# Patient Record
Sex: Male | Born: 1985
Health system: Southern US, Community
[De-identification: ages and names within clinical notes are randomized; demographics above are authoritative.]

---

## 2010-03-14 ENCOUNTER — Encounter: Payer: Self-pay | Admitting: Cardiology

## 2010-03-31 DIAGNOSIS — R0789 Other chest pain: Secondary | ICD-10-CM | POA: Insufficient documentation

## 2010-04-02 ENCOUNTER — Ambulatory Visit: Payer: Self-pay | Admitting: Cardiology

## 2010-04-02 ENCOUNTER — Encounter: Payer: Self-pay | Admitting: Cardiology

## 2010-04-02 DIAGNOSIS — R002 Palpitations: Secondary | ICD-10-CM | POA: Insufficient documentation

## 2010-09-24 ENCOUNTER — Emergency Department (HOSPITAL_COMMUNITY)
Admission: EM | Admit: 2010-09-24 | Discharge: 2010-09-24 | Payer: Self-pay | Source: Home / Self Care | Admitting: Family Medicine

## 2010-10-07 NOTE — Assessment & Plan Note (Signed)
Summary: [facility]   Visit Type:  Initial Consult  CC:  Chest wall pain.  History of Present Illness: 25 year old male for evaluation of chest pain palpitations. No prior cardiac history. TSH 03/16/10 normal at 1.660. Pt states that in May he was having intermittent chest pain. It was in various locations on his chest. It was described as a tightness. It would last seconds and resolve spontaneously. There was no associated nausea, shortness of breath or diaphoresis. It would also occasionally be in his back. It was not pleuritic, positional, related to food or exertional. He was also having occasional palpitations. He was "more aware" of his heart beat. His heart did not race and there was no presyncope or syncope. He otherwise denies any dyspnea on exertion, orthopnea, PND or pedal edema. He does not have exertional chest pain. Because of the above we are asked to further evaluate. Note all of his symptoms have improved since May.  Preventive Screening-Counseling & Management  Alcohol-Tobacco     Smoking Status: never  Current Medications (verified): 1)  Claritin 10 Mg Tabs (Loratadine) .... As Needed  Allergies (verified): No Known Drug Allergies  Past History:  Past Medical History: None  Past Surgical History: None  Family History: Reviewed history and no changes required. None premature CAD or sudden death  Social History: Reviewed history from 03/31/2010 and no changes required. Part Time  Single  Tobacco Use - No.  Alcohol Use - no Smoking Status:  never  Review of Systems       no fevers or chills, productive cough, hemoptysis, dysphasia, odynophagia, melena, hematochezia, dysuria, hematuria, rash, seizure activity, orthopnea, PND, pedal edema, claudication. Remaining systems are negative.   Vital Signs:  Patient profile:   25 year old male Height:      74 inches Weight:      170.50 pounds BMI:     21.97 Pulse rate:   71 / minute Pulse rhythm:    regular Resp:     18 per minute BP sitting:   104 / 70  (left arm) Cuff size:   large  Vitals Entered By: Vikki Ports (April 02, 2010 9:44 AM)  Physical Exam  General:  Well developed/well nourished in NAD Skin warm/dry Patient not depressed but mildly anxious appearing No peripheral clubbing Back-normal HEENT-normal/normal eyelids Neck supple/normal carotid upstroke bilaterally; no bruits; no JVD; no thyromegaly chest - CTA/ normal expansion CV - RRR/normal S1 and S2; no murmurs, rubs or gallops;  PMI nondisplaced Abdomen -NT/ND, no HSM, no mass, + bowel sounds, no bruit 2+ femoral pulses, no bruits Ext-no edema, chords, 2+ DP Neuro-grossly nonfocal     EKG  Procedure date:  03/14/2010  Findings:      Normal sinus rhythm, incomplete right bundle branch block.  EKG  Procedure date:  04/02/2010  Findings:      Normal sinus rhythm at a rate of 72. Axis normal. Incomplete right bundle branch block.  Impression & Recommendations:  Problem # 1:  CHEST DISCOMFORT (ICD-786.59) Symptoms extremely atypical and I do not feel are cardiac. They are also improving. I will not pursue further cardiac evaluation at this time unless his symptoms return. Note electrocardiogram shows no ST changes. Orders: EKG w/ Interpretation (93000)  Problem # 2:  PALPITATIONS (ICD-785.1) These are also improving. He describes a sensation of being aware of his heartbeat. Given that they are improving I will not pursue further evaluation. If they worsen in the future we will consider a CardioNet.  Patient  Instructions: 1)  Your physician recommends that you schedule a follow-up appointment in: AS NEEDED 2)  Your physician recommends that you continue on your current medications as directed. Please refer to the Current Medication list given to you today.

## 2010-10-07 NOTE — Letter (Signed)
Summary: [facility] [place]  [facility] [place]   Imported By: Cala Bradford Mesiemore 04/17/2010 13:28:02  _____________________________________________________________________  External Attachment:    Type:   Image     Comment:   External Document

## 2011-04-16 ENCOUNTER — Encounter: Payer: Self-pay | Admitting: Cardiology

## 2015-11-21 DIAGNOSIS — M62838 Other muscle spasm: Secondary | ICD-10-CM | POA: Diagnosis not present

## 2015-11-21 DIAGNOSIS — M9901 Segmental and somatic dysfunction of cervical region: Secondary | ICD-10-CM | POA: Diagnosis not present

## 2015-11-25 DIAGNOSIS — M62838 Other muscle spasm: Secondary | ICD-10-CM | POA: Diagnosis not present

## 2015-11-25 DIAGNOSIS — M9901 Segmental and somatic dysfunction of cervical region: Secondary | ICD-10-CM | POA: Diagnosis not present

## 2015-11-28 DIAGNOSIS — M9901 Segmental and somatic dysfunction of cervical region: Secondary | ICD-10-CM | POA: Diagnosis not present

## 2015-11-28 DIAGNOSIS — M62838 Other muscle spasm: Secondary | ICD-10-CM | POA: Diagnosis not present

## 2015-12-05 DIAGNOSIS — M62838 Other muscle spasm: Secondary | ICD-10-CM | POA: Diagnosis not present

## 2015-12-05 DIAGNOSIS — M9901 Segmental and somatic dysfunction of cervical region: Secondary | ICD-10-CM | POA: Diagnosis not present

## 2015-12-11 DIAGNOSIS — M62838 Other muscle spasm: Secondary | ICD-10-CM | POA: Diagnosis not present

## 2015-12-11 DIAGNOSIS — M9901 Segmental and somatic dysfunction of cervical region: Secondary | ICD-10-CM | POA: Diagnosis not present

## 2015-12-26 DIAGNOSIS — M62838 Other muscle spasm: Secondary | ICD-10-CM | POA: Diagnosis not present

## 2015-12-26 DIAGNOSIS — M9901 Segmental and somatic dysfunction of cervical region: Secondary | ICD-10-CM | POA: Diagnosis not present

## 2016-01-23 DIAGNOSIS — M9901 Segmental and somatic dysfunction of cervical region: Secondary | ICD-10-CM | POA: Diagnosis not present

## 2016-01-23 DIAGNOSIS — M62838 Other muscle spasm: Secondary | ICD-10-CM | POA: Diagnosis not present

## 2016-02-20 DIAGNOSIS — M9901 Segmental and somatic dysfunction of cervical region: Secondary | ICD-10-CM | POA: Diagnosis not present

## 2016-02-20 DIAGNOSIS — M62838 Other muscle spasm: Secondary | ICD-10-CM | POA: Diagnosis not present

## 2016-03-19 DIAGNOSIS — M9901 Segmental and somatic dysfunction of cervical region: Secondary | ICD-10-CM | POA: Diagnosis not present

## 2016-03-19 DIAGNOSIS — M62838 Other muscle spasm: Secondary | ICD-10-CM | POA: Diagnosis not present

## 2016-04-16 DIAGNOSIS — H5213 Myopia, bilateral: Secondary | ICD-10-CM | POA: Diagnosis not present

## 2016-04-23 DIAGNOSIS — M62838 Other muscle spasm: Secondary | ICD-10-CM | POA: Diagnosis not present

## 2016-04-23 DIAGNOSIS — M9901 Segmental and somatic dysfunction of cervical region: Secondary | ICD-10-CM | POA: Diagnosis not present

## 2016-05-26 DIAGNOSIS — M9901 Segmental and somatic dysfunction of cervical region: Secondary | ICD-10-CM | POA: Diagnosis not present

## 2016-05-26 DIAGNOSIS — M62838 Other muscle spasm: Secondary | ICD-10-CM | POA: Diagnosis not present

## 2016-07-02 DIAGNOSIS — M9901 Segmental and somatic dysfunction of cervical region: Secondary | ICD-10-CM | POA: Diagnosis not present

## 2016-07-02 DIAGNOSIS — M62838 Other muscle spasm: Secondary | ICD-10-CM | POA: Diagnosis not present

## 2016-08-06 DIAGNOSIS — M62838 Other muscle spasm: Secondary | ICD-10-CM | POA: Diagnosis not present

## 2016-08-06 DIAGNOSIS — M9901 Segmental and somatic dysfunction of cervical region: Secondary | ICD-10-CM | POA: Diagnosis not present

## 2016-09-10 DIAGNOSIS — M9901 Segmental and somatic dysfunction of cervical region: Secondary | ICD-10-CM | POA: Diagnosis not present

## 2016-09-10 DIAGNOSIS — M62838 Other muscle spasm: Secondary | ICD-10-CM | POA: Diagnosis not present

## 2016-10-01 DIAGNOSIS — M9901 Segmental and somatic dysfunction of cervical region: Secondary | ICD-10-CM | POA: Diagnosis not present

## 2016-10-01 DIAGNOSIS — M62838 Other muscle spasm: Secondary | ICD-10-CM | POA: Diagnosis not present

## 2016-10-29 DIAGNOSIS — M62838 Other muscle spasm: Secondary | ICD-10-CM | POA: Diagnosis not present

## 2016-10-29 DIAGNOSIS — M9901 Segmental and somatic dysfunction of cervical region: Secondary | ICD-10-CM | POA: Diagnosis not present

## 2016-11-18 DIAGNOSIS — M62838 Other muscle spasm: Secondary | ICD-10-CM | POA: Diagnosis not present

## 2016-11-18 DIAGNOSIS — M9901 Segmental and somatic dysfunction of cervical region: Secondary | ICD-10-CM | POA: Diagnosis not present

## 2016-11-26 DIAGNOSIS — M9901 Segmental and somatic dysfunction of cervical region: Secondary | ICD-10-CM | POA: Diagnosis not present

## 2016-11-26 DIAGNOSIS — M62838 Other muscle spasm: Secondary | ICD-10-CM | POA: Diagnosis not present

## 2016-12-24 DIAGNOSIS — M62838 Other muscle spasm: Secondary | ICD-10-CM | POA: Diagnosis not present

## 2016-12-24 DIAGNOSIS — M9901 Segmental and somatic dysfunction of cervical region: Secondary | ICD-10-CM | POA: Diagnosis not present

## 2017-01-21 DIAGNOSIS — M9901 Segmental and somatic dysfunction of cervical region: Secondary | ICD-10-CM | POA: Diagnosis not present

## 2017-01-21 DIAGNOSIS — M62838 Other muscle spasm: Secondary | ICD-10-CM | POA: Diagnosis not present

## 2017-02-18 DIAGNOSIS — M62838 Other muscle spasm: Secondary | ICD-10-CM | POA: Diagnosis not present

## 2017-02-18 DIAGNOSIS — M9901 Segmental and somatic dysfunction of cervical region: Secondary | ICD-10-CM | POA: Diagnosis not present

## 2017-02-25 DIAGNOSIS — M9901 Segmental and somatic dysfunction of cervical region: Secondary | ICD-10-CM | POA: Diagnosis not present

## 2017-02-25 DIAGNOSIS — M62838 Other muscle spasm: Secondary | ICD-10-CM | POA: Diagnosis not present

## 2017-03-18 DIAGNOSIS — M9901 Segmental and somatic dysfunction of cervical region: Secondary | ICD-10-CM | POA: Diagnosis not present

## 2017-03-18 DIAGNOSIS — M62838 Other muscle spasm: Secondary | ICD-10-CM | POA: Diagnosis not present

## 2017-04-15 DIAGNOSIS — M62838 Other muscle spasm: Secondary | ICD-10-CM | POA: Diagnosis not present

## 2017-04-15 DIAGNOSIS — M9901 Segmental and somatic dysfunction of cervical region: Secondary | ICD-10-CM | POA: Diagnosis not present

## 2017-05-20 DIAGNOSIS — M62838 Other muscle spasm: Secondary | ICD-10-CM | POA: Diagnosis not present

## 2017-05-20 DIAGNOSIS — M9901 Segmental and somatic dysfunction of cervical region: Secondary | ICD-10-CM | POA: Diagnosis not present

## 2017-06-24 DIAGNOSIS — M62838 Other muscle spasm: Secondary | ICD-10-CM | POA: Diagnosis not present

## 2017-06-24 DIAGNOSIS — M9901 Segmental and somatic dysfunction of cervical region: Secondary | ICD-10-CM | POA: Diagnosis not present

## 2017-07-22 DIAGNOSIS — M62838 Other muscle spasm: Secondary | ICD-10-CM | POA: Diagnosis not present

## 2017-07-22 DIAGNOSIS — M9901 Segmental and somatic dysfunction of cervical region: Secondary | ICD-10-CM | POA: Diagnosis not present

## 2017-08-19 DIAGNOSIS — M62838 Other muscle spasm: Secondary | ICD-10-CM | POA: Diagnosis not present

## 2017-08-19 DIAGNOSIS — M9901 Segmental and somatic dysfunction of cervical region: Secondary | ICD-10-CM | POA: Diagnosis not present

## 2017-09-16 DIAGNOSIS — M9901 Segmental and somatic dysfunction of cervical region: Secondary | ICD-10-CM | POA: Diagnosis not present

## 2017-09-16 DIAGNOSIS — M62838 Other muscle spasm: Secondary | ICD-10-CM | POA: Diagnosis not present

## 2017-10-14 DIAGNOSIS — M9901 Segmental and somatic dysfunction of cervical region: Secondary | ICD-10-CM | POA: Diagnosis not present

## 2017-10-14 DIAGNOSIS — M62838 Other muscle spasm: Secondary | ICD-10-CM | POA: Diagnosis not present

## 2017-11-11 DIAGNOSIS — M9901 Segmental and somatic dysfunction of cervical region: Secondary | ICD-10-CM | POA: Diagnosis not present

## 2017-11-11 DIAGNOSIS — M62838 Other muscle spasm: Secondary | ICD-10-CM | POA: Diagnosis not present

## 2017-11-24 DIAGNOSIS — H5213 Myopia, bilateral: Secondary | ICD-10-CM | POA: Diagnosis not present

## 2017-12-09 DIAGNOSIS — M9901 Segmental and somatic dysfunction of cervical region: Secondary | ICD-10-CM | POA: Diagnosis not present

## 2017-12-09 DIAGNOSIS — M62838 Other muscle spasm: Secondary | ICD-10-CM | POA: Diagnosis not present

## 2018-01-10 DIAGNOSIS — M62838 Other muscle spasm: Secondary | ICD-10-CM | POA: Diagnosis not present

## 2018-01-10 DIAGNOSIS — M9901 Segmental and somatic dysfunction of cervical region: Secondary | ICD-10-CM | POA: Diagnosis not present

## 2018-02-09 DIAGNOSIS — M9901 Segmental and somatic dysfunction of cervical region: Secondary | ICD-10-CM | POA: Diagnosis not present

## 2018-02-09 DIAGNOSIS — M62838 Other muscle spasm: Secondary | ICD-10-CM | POA: Diagnosis not present

## 2018-02-14 DIAGNOSIS — M9901 Segmental and somatic dysfunction of cervical region: Secondary | ICD-10-CM | POA: Diagnosis not present

## 2018-02-14 DIAGNOSIS — M62838 Other muscle spasm: Secondary | ICD-10-CM | POA: Diagnosis not present

## 2018-02-23 ENCOUNTER — Ambulatory Visit (INDEPENDENT_AMBULATORY_CARE_PROVIDER_SITE_OTHER): Payer: Self-pay | Admitting: Nurse Practitioner

## 2018-02-23 VITALS — BP 105/70 | HR 70 | Temp 97.8°F | Resp 16 | Wt 194.0 lb

## 2018-02-23 DIAGNOSIS — Z0000 Encounter for general adult medical examination without abnormal findings: Secondary | ICD-10-CM

## 2018-02-23 NOTE — Progress Notes (Signed)
Subjective:  Dennis Grant is a 32 y.o. male who presents for basic physical exam.  Patient presents today for his basic health assessment as a requirement for [employer] to keep his health premiums low.  The patient denies any past meds medical history of heart disease, lung disease, kidney disease, diabetes, asthma, or seizures.  The patient does have a history of seasonal allergies for which she takes Zyrtec daily.  Patient denies any current health related concerns today.  Reviewed the patient's current medications, and allergies.  The patient denies any past surgical history other than wisdom tooth extraction.   Patient's family medical history includes hypertension on his mother's side, and the patient states his father does not have any health problems at this time.  The patient's immunizations are up-to-date.  The patient denies any history of recreational drug use, smoking, or alcohol use.  The patient does not have any kids, and does not pick married, the patient lives at home with a roommate.   Social History   Tobacco Use  . Smoking status: Never Smoker  Substance Use Topics  . Alcohol use: No  . Drug use: Not on file     Current Outpatient Medications  Medication Sig Dispense Refill  . cetirizine (ZYRTEC) 10 MG tablet Take 10 mg by mouth daily.    Marland Kitchen loratadine (CLARITIN) 10 MG tablet Take 10 mg by mouth as needed.       No current facility-administered medications for this visit.     Review of Systems  Constitutional: Negative.   HENT: Negative.   Eyes: Negative.   Respiratory: Negative.   Cardiovascular: Positive for palpitations.       Has a history of heart palpitations in 2012.  Patient states this issue is now resolved.  Gastrointestinal: Negative.   Genitourinary: Negative.   Musculoskeletal: Negative.   Skin: Negative.   Endo/Heme/Allergies: Positive for environmental allergies.     Objective:  BP 105/70 (BP Location: Right Arm, Patient Position:  Sitting, Cuff Size: Normal)   Pulse 70   Temp 97.8 F (36.6 C) (Oral)   Resp 16   Wt 194 lb (88 kg)   SpO2 99%   BMI 24.91 kg/m   General Appearance:  Alert, cooperative, no distress, appears stated age  Head:  Normocephalic, without obvious abnormality, atraumatic  Eyes:  PERRL, conjunctiva/corneas clear, EOM's intact, fundi benign, both eyes  Ears:  Normal TM's and external ear canals, both ears  Nose: Nares normal, septum midline, mucosa normal, no drainage or sinus tenderness  Throat: Lips, mucosa, and tongue normal; teeth and gums normal  Neck: Supple, symmetrical, trachea midline, no adenopathy, thyroid: not enlarged, symmetric, no tenderness/mass/nodules, no carotid bruit or JVD  Back:   Symmetric, no curvature, ROM normal, no CVA tenderness  Lungs:   Clear to auscultation bilaterally, respirations unlabored  Chest Wall:  No tenderness or deformity  Heart:  Regular rate and rhythm, S1, S2 normal, no murmur, rub or gallop  Abdomen:   Soft, non-tender, bowel sounds active all four quadrants,  no masses, no organomegaly  Genitalia:  Deferred  Rectal:  Deferred  Extremities: Extremities normal, atraumatic, no cyanosis or edema  Pulses: 2+ and symmetric  Skin: Skin color, texture, turgor normal, no rashes or lesions  Lymph nodes: Cervical, supraclavicular, nodes normal  Neurologic: Normal        Assessment:  basic physical exam    Plan:  Patient education provided.  Directed patient to follow-up with PCP to have an EKG  since it has been quite some time.  This will help establish a baseline for the patient in case he develops any other heart symptoms.  Patient education was provided on health maintenance and health prevention for his age group. The patient  will also follow with his PCP for lab work, or any further diagnostic testing.  The patient verbalizes understanding and has no questions at time of discharge. Patient will follow up with PCP.

## 2018-02-23 NOTE — Patient Instructions (Signed)
Health Maintenance, Male A healthy lifestyle and preventive care is important for your health and wellness. Ask your health care provider about what schedule of regular examinations is right for you. What should I know about weight and diet? Eat a Healthy Diet  Eat plenty of vegetables, fruits, whole grains, low-fat dairy products, and lean protein.  Do not eat a lot of foods high in solid fats, added sugars, or salt.  Maintain a Healthy Weight Regular exercise can help you achieve or maintain a healthy weight. You should:  Do at least 150 minutes of exercise each week. The exercise should increase your heart rate and make you sweat (moderate-intensity exercise).  Do strength-training exercises at least twice a week.  Watch Your Levels of Cholesterol and Blood Lipids  Have your blood tested for lipids and cholesterol every 5 years starting at 34 years of age. If you are at high risk for heart disease, you should start having your blood tested when you are 34 years old. You may need to have your cholesterol levels checked more often if: ? Your lipid or cholesterol levels are high. ? You are older than 34 years of age. ? You are at high risk for heart disease.  What should I know about cancer screening? Many types of cancers can be detected early and may often be prevented. Lung Cancer  You should be screened every year for lung cancer if: ? You are a current smoker who has smoked for at least 30 years. ? You are a former smoker who has quit within the past 15 years.  Talk to your health care provider about your screening options, when you should start screening, and how often you should be screened.  Colorectal Cancer  Routine colorectal cancer screening usually begins at 34 years of age and should be repeated every 5-10 years until you are 34 years old. You may need to be screened more often if early forms of precancerous polyps or small growths are found. Your health care provider  may recommend screening at an earlier age if you have risk factors for colon cancer.  Your health care provider may recommend using home test kits to check for hidden blood in the stool.  A small camera at the end of a tube can be used to examine your colon (sigmoidoscopy or colonoscopy). This checks for the earliest forms of colorectal cancer.  Prostate and Testicular Cancer  Depending on your age and overall health, your health care provider may do certain tests to screen for prostate and testicular cancer.  Talk to your health care provider about any symptoms or concerns you have about testicular or prostate cancer.  Skin Cancer  Check your skin from head to toe regularly.  Tell your health care provider about any new moles or changes in moles, especially if: ? There is a change in a mole's size, shape, or color. ? You have a mole that is larger than a pencil eraser.  Always use sunscreen. Apply sunscreen liberally and repeat throughout the day.  Protect yourself by wearing long sleeves, pants, a wide-brimmed hat, and sunglasses when outside.  What should I know about heart disease, diabetes, and high blood pressure?  If you are 89-13 years of age, have your blood pressure checked every 3-5 years. If you are 70 years of age or older, have your blood pressure checked every year. You should have your blood pressure measured twice-once when you are at a hospital or clinic, and once when  you are not at a hospital or clinic. Record the average of the two measurements. To check your blood pressure when you are not at a hospital or clinic, you can use: ? An automated blood pressure machine at a pharmacy. ? A home blood pressure monitor.  Talk to your health care provider about your target blood pressure.  If you are between 35-28 years old, ask your health care provider if you should take aspirin to prevent heart disease.  Have regular diabetes screenings by checking your fasting blood  sugar level. ? If you are at a normal weight and have a low risk for diabetes, have this test once every three years after the age of 64. ? If you are overweight and have a high risk for diabetes, consider being tested at a younger age or more often.  A one-time screening for abdominal aortic aneurysm (AAA) by ultrasound is recommended for men aged 71-75 years who are current or former smokers. What should I know about preventing infection? Hepatitis B If you have a higher risk for hepatitis B, you should be screened for this virus. Talk with your health care provider to find out if you are at risk for hepatitis B infection. Hepatitis C Blood testing is recommended for:  Everyone born from [birth year] through 1967.  Anyone with known risk factors for hepatitis C.  Sexually Transmitted Diseases (STDs)  You should be screened each year for STDs including gonorrhea and chlamydia if: ? You are sexually active and are younger than 34 years of age. ? You are older than 34 years of age and your health care provider tells you that you are at risk for this type of infection. ? Your sexual activity has changed since you were last screened and you are at an increased risk for chlamydia or gonorrhea. Ask your health care provider if you are at risk.  Talk with your health care provider about whether you are at high risk of being infected with HIV. Your health care provider may recommend a prescription medicine to help prevent HIV infection.  What else can I do?  Schedule regular health, dental, and eye exams.  Stay current with your vaccines (immunizations).  Do not use any tobacco products, such as cigarettes, chewing tobacco, and e-cigarettes. If you need help quitting, ask your health care provider.  Limit alcohol intake to no more than 2 drinks per day. One drink equals 12 ounces of beer, 5 ounces of wine, or 1 ounces of hard liquor.  Do not use street drugs.  Do not share needles.  Ask your  health care provider for help if you need support or information about quitting drugs.  Tell your health care provider if you often feel depressed.  Tell your health care provider if you have ever been abused or do not feel safe at home. This information is not intended to replace advice given to you by your health care provider. Make sure you discuss any questions you have with your health care provider. Document Released: 02/20/2008 Document Revised: 04/22/2016 Document Reviewed: 05/28/2015 Elsevier Interactive Patient Education  2018 [place] 18-39 Years, Male Preventive care refers to lifestyle choices and visits with your health care provider that can promote health and wellness. What does preventive care include?  A yearly physical exam. This is also called an annual well check.  Dental exams once or twice a year.  Routine eye exams. Ask your health care provider how often you should have  your eyes checked.  Personal lifestyle choices, including: ? Daily care of your teeth and gums. ? Regular physical activity. ? Eating a healthy diet. ? Avoiding tobacco and drug use. ? Limiting alcohol use. ? Practicing safe sex. What happens during an annual well check? The services and screenings done by your health care provider during your annual well check will depend on your age, overall health, lifestyle risk factors, and family history of disease. Counseling Your health care provider may ask you questions about your:  Alcohol use.  Tobacco use.  Drug use.  Emotional well-being.  Home and relationship well-being.  Sexual activity.  Eating habits.  Work and work environment.  Screening You may have the following tests or measurements:  Height, weight, and BMI.  Blood pressure.  Lipid and cholesterol levels. These may be checked every 5 years starting at age 20.  Diabetes screening. This is done by checking your blood sugar (glucose) after you  have not eaten for a while (fasting).  Skin check.  Hepatitis C blood test.  Hepatitis B blood test.  Sexually transmitted disease (STD) testing.  Discuss your test results, treatment options, and if necessary, the need for more tests with your health care provider. Vaccines Your health care provider may recommend certain vaccines, such as:  Influenza vaccine. This is recommended every year.  Tetanus, diphtheria, and acellular pertussis (Tdap, Td) vaccine. You may need a Td booster every 10 years.  Varicella vaccine. You may need this if you have not been vaccinated.  HPV vaccine. If you are 26 or younger, you may need three doses over 6 months.  Measles, mumps, and rubella (MMR) vaccine. You may need at least one dose of MMR.You may also need a second dose.  Pneumococcal 13-valent conjugate (PCV13) vaccine. You may need this if you have certain conditions and have not been vaccinated.  Pneumococcal polysaccharide (PPSV23) vaccine. You may need one or two doses if you smoke cigarettes or if you have certain conditions.  Meningococcal vaccine. One dose is recommended if you are age 19-21 years and a first-year college student living in a residence hall, or if you have one of several medical conditions. You may also need additional booster doses.  Hepatitis A vaccine. You may need this if you have certain conditions or if you travel or work in places where you may be exposed to hepatitis A.  Hepatitis B vaccine. You may need this if you have certain conditions or if you travel or work in places where you may be exposed to hepatitis B.  Haemophilus influenzae type b (Hib) vaccine. You may need this if you have certain risk factors.  Talk to your health care provider about which screenings and vaccines you need and how often you need them. This information is not intended to replace advice given to you by your health care provider. Make sure you discuss any questions you have with  your health care provider. Document Released: 10/20/2001 Document Revised: 05/13/2016 Document Reviewed: 06/25/2015 Elsevier Interactive Patient Education  2018 [employer] Inc[employer]  

## 2018-03-09 DIAGNOSIS — M9901 Segmental and somatic dysfunction of cervical region: Secondary | ICD-10-CM | POA: Diagnosis not present

## 2018-03-09 DIAGNOSIS — M62838 Other muscle spasm: Secondary | ICD-10-CM | POA: Diagnosis not present

## 2018-04-14 DIAGNOSIS — M62838 Other muscle spasm: Secondary | ICD-10-CM | POA: Diagnosis not present

## 2018-04-14 DIAGNOSIS — M9901 Segmental and somatic dysfunction of cervical region: Secondary | ICD-10-CM | POA: Diagnosis not present

## 2018-05-03 ENCOUNTER — Ambulatory Visit: Payer: Self-pay

## 2018-05-03 ENCOUNTER — Ambulatory Visit: Payer: 59 | Admitting: Sports Medicine

## 2018-05-03 ENCOUNTER — Ambulatory Visit (INDEPENDENT_AMBULATORY_CARE_PROVIDER_SITE_OTHER): Payer: 59

## 2018-05-03 ENCOUNTER — Encounter: Payer: Self-pay | Admitting: Sports Medicine

## 2018-05-03 VITALS — BP 110/78 | HR 74 | Ht 74.0 in | Wt 191.4 lb

## 2018-05-03 DIAGNOSIS — M25561 Pain in right knee: Secondary | ICD-10-CM

## 2018-05-03 DIAGNOSIS — M222X1 Patellofemoral disorders, right knee: Secondary | ICD-10-CM | POA: Diagnosis not present

## 2018-05-03 NOTE — Progress Notes (Signed)
PROCEDURE NOTE: THERAPEUTIC EXERCISES (97110) 15 minutes spent for Therapeutic exercises as below and as referenced in the AVS.  This included exercises focusing on stretching, strengthening, with significant focus on eccentric aspects.   Proper technique shown and discussed handout in great detail with ATC.  All questions were discussed and answered.   Long term goals include an improvement in range of motion, strength, endurance as well as avoiding reinjury. Frequency of visits is one time as determined during today's  office visit. Frequency of exercises to be performed is as per handout.  EXERCISES REVIEWED:  Hip ABduction strengthening with focus on Glute Medius Recruitment  VMO Strengthening  With short arc quad

## 2018-05-03 NOTE — Progress Notes (Signed)
Dennis Grant. [facility] [phone]  Dennis Grant - 32 y.o. male MRN [id number]  Date of birth: May 16, 1986  Visit Date: 05/03/2018  PCP: No primary care provider on file.   Referred by: No ref. provider found  Scribe(s) for today's visit: Dennis Grant, CMA  SUBJECTIVE:  Dennis Grant is here for Initial Assessment (R knee pain)   His R knee pain symptoms INITIALLY: Began about 2 months ago. Sx started when riding up right stationary bike. He has not gone for a ride in over a month and sx are persisting. He denies past injury to the R knee.  Described as mild stiffness, non-radiating. Worsened with bending, sitting with knee bent.  Improved with walking, standing. Additional associated symptoms include: He does note popping in the R knee. He did notice mild swelling around the knee when sx were at worst. He reports occasional weakness in the R knee. When sx were at worst he did have trouble with weight bearing. Pain is located distal to the patella.     At this time symptoms are improving compared to onset, over the past 3-4 weeks, not currently riding bike.  He has been taking Motrin/Tylenol with some relief.   No recent XR of R knee.    REVIEW OF SYSTEMS: Reports night time disturbances. Denies fevers, chills, or night sweats. Denies unexplained weight loss. Denies personal history of cancer. Denies changes in bowel or bladder habits. Denies recent unreported falls. Denies new or worsening dyspnea or wheezing. Denies headaches or dizziness.  Reports weakness in R leg/knee.  Denies dizziness or presyncopal episodes Denies lower extremity edema     HISTORY & PERTINENT PRIOR DATA:  Significant/pertinent history, findings, studies include:  reports that he has never smoked. He has never used smokeless tobacco. No results for input(s): HGBA1C, LABURIC, CREATINE in the last 8760 hours. No specialty comments  available. Problem  Patellofemoral Pain Syndrome of Right Knee    Otherwise prior history reviewed and updated per electronic medical record.   OBJECTIVE:  VS:  HT:6\' 2"  (188 cm)   WT:191 lb 6.4 oz (86.8 kg)  BMI:24.56    BP:110/78  HR:74bpm  TEMP: ( )  RESP:98 %   PHYSICAL EXAM: CONSTITUTIONAL: Well-developed, Well-nourished and In no acute distress Alert & appropriately interactive. and Not depressed or anxious appearing. RESPIRATORY: No increased work of breathing and Trachea Midline EYES: Pupils are equal., EOM intact without nystagmus. and No scleral icterus.  Lower extremities: Warm and well perfused NEURO: unremarkable  MSK Exam:  Right Knee  Alignment & Contours: normal Skin: No overlying erythema/ecchymosis Effusion: none   Generalized Synovitis: none Knee Tenderness: Patellar tendon and No focal bony TTP Gait: normal Patellar grind produces: Mild pain and No crepitation   RANGE OF MOTION & STRENGTH  EXTENSION: Normal  with no pain.   Strength: Normal FLEXION: Normal with no pain.   Strength: Normal HIP ABDUCTION: Strength: 4/5 Recruitment pattern: TFL predominant   LIGAMENTOUS TESTING  Varus & Valgus Strain: stable to testing Anterior & Posterior Drawer: stable to testing Lachman's: stable to testing   SPECIALITY TESTING:  Patellar Apprehension: positive, mild pain J Sign: positive, mild pain Mcmurray's: normal, no pain Thessaly: normal, no pain     PROCEDURES & DATA REVIEWED:  . Discussed the foundation of treatment for this condition is physical therapy and/or daily (5-6 days/week) therapeutic exercises, focusing on core strengthening, coordination, neuromuscular control/reeducation.  Therapeutic exercises prescribed  per procedure note. . X-Rays obtained today and reviewed with the patient that showed: Early mild patellofemoral wear but this is minimal.  Otherwise normal x-rays  ASSESSMENT   1. Right knee pain, unspecified chronicity   2.  Patellofemoral pain syndrome of right knee     PLAN:   Apply ice packs    . Please see procedure section and notes. . Symptoms are consistent with patellofemoral pain syndrome . Therapeutic exercises and topical Pennsaid prescription and sample provided. No problem-specific Assessment & Plan notes found for this encounter.   Follow-up: Return if symptoms worsen or fail to improve, for as needed for ongoing issues.      Please see additional documentation for Objective, Assessment and Plan sections. Pertinent additional documentation may be included in corresponding procedure notes, imaging studies, problem based documentation and patient instructions. Please see these sections of the encounter for additional information regarding this visit.  CMA/ATC served as Neurosurgeonscribe during this visit. History, Physical, and Plan performed by medical provider. Documentation and orders reviewed and attested to.      Dennis MewsMichael D Dewaun Kinzler, DO    [facility] Phys[facility]

## 2018-05-08 ENCOUNTER — Encounter: Payer: Self-pay | Admitting: Sports Medicine

## 2018-05-08 DIAGNOSIS — M222X1 Patellofemoral disorders, right knee: Secondary | ICD-10-CM | POA: Insufficient documentation

## 2018-05-11 MED ORDER — DICLOFENAC SODIUM 2 % TD SOLN: 1.0000 "application " | Freq: Two times a day (BID) | TRANSDERMAL | 2 refills | Status: DC

## 2018-05-11 MED ORDER — DICLOFENAC SODIUM 2 % TD SOLN: 1.0000 "application " | Freq: Two times a day (BID) | TRANSDERMAL | 0 refills | Status: AC

## 2018-05-11 NOTE — Addendum Note (Signed)
Addended by: Dierdre Searles on: 05/11/2018 08:00 AM   Modules accepted: Orders

## 2018-05-12 DIAGNOSIS — M9901 Segmental and somatic dysfunction of cervical region: Secondary | ICD-10-CM | POA: Diagnosis not present

## 2018-05-12 DIAGNOSIS — M62838 Other muscle spasm: Secondary | ICD-10-CM | POA: Diagnosis not present

## 2018-05-19 ENCOUNTER — Ambulatory Visit: Payer: Self-pay | Admitting: Family Medicine

## 2018-05-25 ENCOUNTER — Ambulatory Visit: Payer: 59 | Admitting: Family Medicine

## 2018-05-25 ENCOUNTER — Encounter: Payer: Self-pay | Admitting: Family Medicine

## 2018-05-25 VITALS — BP 118/74 | HR 80 | Temp 98.6°F | Ht 74.0 in | Wt 189.0 lb

## 2018-05-25 DIAGNOSIS — Z114 Encounter for screening for human immunodeficiency virus [HIV]: Secondary | ICD-10-CM

## 2018-05-25 DIAGNOSIS — Z13220 Encounter for screening for lipoid disorders: Secondary | ICD-10-CM

## 2018-05-25 DIAGNOSIS — Z0000 Encounter for general adult medical examination without abnormal findings: Secondary | ICD-10-CM | POA: Diagnosis not present

## 2018-05-25 DIAGNOSIS — Z131 Encounter for screening for diabetes mellitus: Secondary | ICD-10-CM | POA: Diagnosis not present

## 2018-05-25 LAB — LIPID PANEL
CHOL/HDL RATIO: 5
Cholesterol: 147 mg/dL (ref 0–200)
HDL: 32.2 mg/dL — AB (ref >39.00)
LDL Cholesterol: 91 mg/dL (ref 0–99)
NONHDL: 114.63
Triglycerides: 117 mg/dL (ref 0.0–149.0)
VLDL: 23.4 mg/dL (ref 0.0–40.0)

## 2018-05-25 LAB — GLUCOSE, RANDOM: GLUCOSE: 89 mg/dL (ref 70–99)

## 2018-05-25 NOTE — Patient Instructions (Signed)
It was very nice to see you today!  Keep up the good work.  We will check blood work today.  Please get your flu shot soon.  Come back to see me in 1 year, or sooner as needed.   Take care, Dr Jerline Pain   Preventive Care 18-39 Years, Male Preventive care refers to lifestyle choices and visits with your health care provider that can promote health and wellness. What does preventive care include?  A yearly physical exam. This is also called an annual well check.  Dental exams once or twice a year.  Routine eye exams. Ask your health care provider how often you should have your eyes checked.  Personal lifestyle choices, including: ? Daily care of your teeth and gums. ? Regular physical activity. ? Eating a healthy diet. ? Avoiding tobacco and drug use. ? Limiting alcohol use. ? Practicing safe sex. What happens during an annual well check? The services and screenings done by your health care provider during your annual well check will depend on your age, overall health, lifestyle risk factors, and family history of disease. Counseling Your health care provider may ask you questions about your:  Alcohol use.  Tobacco use.  Drug use.  Emotional well-being.  Home and relationship well-being.  Sexual activity.  Eating habits.  Work and work Statistician.  Screening You may have the following tests or measurements:  Height, weight, and BMI.  Blood pressure.  Lipid and cholesterol levels. These may be checked every 5 years starting at age 33.  Diabetes screening. This is done by checking your blood sugar (glucose) after you have not eaten for a while (fasting).  Skin check.  Hepatitis C blood test.  Hepatitis B blood test.  Sexually transmitted disease (STD) testing.  Discuss your test results, treatment options, and if necessary, the need for more tests with your health care provider. Vaccines Your health care provider may recommend certain vaccines, such  as:  Influenza vaccine. This is recommended every year.  Tetanus, diphtheria, and acellular pertussis (Tdap, Td) vaccine. You may need a Td booster every 10 years.  Varicella vaccine. You may need this if you have not been vaccinated.  HPV vaccine. If you are 71 or younger, you may need three doses over 6 months.  Measles, mumps, and rubella (MMR) vaccine. You may need at least one dose of MMR.You may also need a second dose.  Pneumococcal 13-valent conjugate (PCV13) vaccine. You may need this if you have certain conditions and have not been vaccinated.  Pneumococcal polysaccharide (PPSV23) vaccine. You may need one or two doses if you smoke cigarettes or if you have certain conditions.  Meningococcal vaccine. One dose is recommended if you are age 28-21 years and a first-year college student living in a residence hall, or if you have one of several medical conditions. You may also need additional booster doses.  Hepatitis A vaccine. You may need this if you have certain conditions or if you travel or work in places where you may be exposed to hepatitis A.  Hepatitis B vaccine. You may need this if you have certain conditions or if you travel or work in places where you may be exposed to hepatitis B.  Haemophilus influenzae type b (Hib) vaccine. You may need this if you have certain risk factors.  Talk to your health care provider about which screenings and vaccines you need and how often you need them. This information is not intended to replace advice given to you by  your health care provider. Make sure you discuss any questions you have with your health care provider. Document Released: 10/20/2001 Document Revised: 05/13/2016 Document Reviewed: 06/25/2015 Elsevier Interactive Patient Education  2018 Elsevier Inc[employer]  

## 2018-05-25 NOTE — Progress Notes (Signed)
Subjective:  Dennis Grant is a 32 y.o. male who presents today for his annual comprehensive physical exam and to establish care.  HPI:   He has no acute complaints today.   Lifestyle Diet: No specific diets.  Exercise: Rides stationary bike regularly.   Depression screen PHQ 2/9 05/25/2018  Decreased Interest 0  Down, Depressed, Hopeless 0  PHQ - 2 Score 0  Altered sleeping -  Tired, decreased energy -  Change in appetite -  Feeling bad or failure about yourself  -  Trouble concentrating -  Moving slowly or fidgety/restless -  Suicidal thoughts -  PHQ-9 Score -    Health Maintenance Due  Topic Date Due  . HIV Screening  11/02/1998  . TETANUS/TDAP  11/02/2002  . INFLUENZA VACCINE  04/07/2018     ROS: Seasonal allergies, otherwise a complete review of systems was negative.   PMH:  The following were reviewed and entered/updated in epic: History reviewed. No pertinent past medical history. Patient Active Problem List   Diagnosis Date Noted  . Patellofemoral pain syndrome of right knee 05/08/2018  . PALPITATIONS 04/02/2010  . CHEST DISCOMFORT 03/31/2010   History reviewed. No pertinent surgical history.  Family History  Problem Relation Age of Onset  . Coronary artery disease Unknown        no premature cad or sudden death  . Hypertension Mother   . Cancer Maternal Aunt        Leukemia    Medications- reviewed and updated Current Outpatient Medications  Medication Sig Dispense Refill  . cetirizine (ZYRTEC) 10 MG tablet Take 10 mg by mouth daily.    . Diclofenac Sodium (PENNSAID) 2 % SOLN Place 1 application onto the skin 2 (two) times daily. (Patient not taking: Reported on 05/25/2018) 112 g 2   No current facility-administered medications for this visit.     Allergies-reviewed and updated Allergies  Allergen Reactions  . Penicillins     Episode of flushing and sweating    Social History   Socioeconomic History  . Marital status: Single   Spouse name: Not on file  . Number of children: Not on file  . Years of education: Not on file  . Highest education level: Not on file  Occupational History  . Occupation: Part Time  Social Needs  . Financial resource strain: Not on file  . Food insecurity:    Worry: Not on file    Inability: Not on file  . Transportation needs:    Medical: Not on file    Non-medical: Not on file  Tobacco Use  . Smoking status: Never Smoker  . Smokeless tobacco: Never Used  Substance and Sexual Activity  . Alcohol use: No  . Drug use: Never  . Sexual activity: Not on file  Lifestyle  . Physical activity:    Days per week: Not on file    Minutes per session: Not on file  . Stress: Not on file  Relationships  . Social connections:    Talks on phone: Not on file    Gets together: Not on file    Attends religious service: Not on file    Active member of club or organization: Not on file    Attends meetings of clubs or organizations: Not on file    Relationship status: Not on file  Other Topics Concern  . Not on file  Social History Narrative  . Not on file    Objective:  Physical Exam: BP 118/74 (BP  Location: Left Arm, Patient Position: Sitting, Cuff Size: Normal)   Pulse 80   Temp 98.6 F (37 C) (Oral)   Ht 6\' 2"  (1.88 m)   Wt 189 lb (85.7 kg)   SpO2 98%   BMI 24.27 kg/m   Body mass index is 24.27 kg/m. Wt Readings from Last 3 Encounters:  05/25/18 189 lb (85.7 kg)  05/03/18 191 lb 6.4 oz (86.8 kg)  02/23/18 194 lb (88 kg)   Gen: NAD, resting comfortably HEENT: TMs normal bilaterally. OP clear. No thyromegaly noted.  CV: RRR with no murmurs appreciated Pulm: NWOB, CTAB with no crackles, wheezes, or rhonchi GI: Normal bowel sounds present. Soft, Nontender, Nondistended. MSK: no edema, cyanosis, or clubbing noted Skin: warm, dry Neuro: CN2-12 grossly intact. Strength 5/5 in upper and lower extremities. Reflexes symmetric and intact bilaterally.  Psych: Normal affect and  thought content  Assessment/Plan:  Preventative Healthcare: Check HIV antibody, lipid panel, and fasting glucose. Flu shot deferred.   Patient Counseling(The following topics were reviewed and/or handout was given):  -Nutrition: Stressed importance of moderation in sodium/caffeine intake, saturated fat and cholesterol, caloric balance, sufficient intake of fresh fruits, vegetables, and fiber.  -Stressed the importance of regular exercise.   -Substance Abuse: Discussed cessation/primary prevention of tobacco, alcohol, or other drug use; driving or other dangerous activities under the influence; availability of treatment for abuse.   -Injury prevention: Discussed safety belts, safety helmets, smoke detector, smoking near bedding or upholstery.   -Sexuality: Discussed sexually transmitted diseases, partner selection, use of condoms, avoidance of unintended pregnancy and contraceptive alternatives.   -Dental health: Discussed importance of regular tooth brushing, flossing, and dental visits.  -Health maintenance and immunizations reviewed. Please refer to Health maintenance section.  Return to care in 1 year for next preventative visit.   Katina Degree. Jimmey Ralph, MD 05/25/2018 11:23 AM

## 2018-05-26 LAB — HIV ANTIBODY (ROUTINE TESTING W REFLEX): HIV 1&2 Ab, 4th Generation: NONREACTIVE

## 2018-05-26 NOTE — Progress Notes (Signed)
Dr Lavone NeriParker's interpretation of your lab work:  Your blood sugar level is normal. Your "good" cholesterol was a bit low, but the rest of your cholesterol levels were normal.  We do not need to make any changes to your treatment plan at this point. Continue working on a healthy diet and regular exercise and we can recheck in 1-3 years.  If you have any additional questions, please give us a call or send us a message through [email or web address].  Take care, Dr Jimmey RalphParker

## 2018-05-31 NOTE — Progress Notes (Signed)
Dr Auron Tadros's interpretation of your lab work:  Your HIV test was negative.    If you have any additional questions, please give us a call or send us a message through mychart.  Take care, Dr Libni Fusaro

## 2018-06-09 DIAGNOSIS — M9901 Segmental and somatic dysfunction of cervical region: Secondary | ICD-10-CM | POA: Diagnosis not present

## 2018-06-09 DIAGNOSIS — M62838 Other muscle spasm: Secondary | ICD-10-CM | POA: Diagnosis not present

## 2018-06-30 DIAGNOSIS — M62838 Other muscle spasm: Secondary | ICD-10-CM | POA: Diagnosis not present

## 2018-06-30 DIAGNOSIS — M9901 Segmental and somatic dysfunction of cervical region: Secondary | ICD-10-CM | POA: Diagnosis not present

## 2018-07-07 DIAGNOSIS — M62838 Other muscle spasm: Secondary | ICD-10-CM | POA: Diagnosis not present

## 2018-07-07 DIAGNOSIS — M9901 Segmental and somatic dysfunction of cervical region: Secondary | ICD-10-CM | POA: Diagnosis not present

## 2018-07-26 DIAGNOSIS — M9901 Segmental and somatic dysfunction of cervical region: Secondary | ICD-10-CM | POA: Diagnosis not present

## 2018-07-26 DIAGNOSIS — M62838 Other muscle spasm: Secondary | ICD-10-CM | POA: Diagnosis not present

## 2018-08-25 DIAGNOSIS — M9901 Segmental and somatic dysfunction of cervical region: Secondary | ICD-10-CM | POA: Diagnosis not present

## 2018-08-25 DIAGNOSIS — M62838 Other muscle spasm: Secondary | ICD-10-CM | POA: Diagnosis not present

## 2018-09-13 DIAGNOSIS — M546 Pain in thoracic spine: Secondary | ICD-10-CM | POA: Diagnosis not present

## 2018-09-13 DIAGNOSIS — M542 Cervicalgia: Secondary | ICD-10-CM | POA: Diagnosis not present

## 2018-09-13 DIAGNOSIS — M9902 Segmental and somatic dysfunction of thoracic region: Secondary | ICD-10-CM | POA: Diagnosis not present

## 2018-09-13 DIAGNOSIS — M9901 Segmental and somatic dysfunction of cervical region: Secondary | ICD-10-CM | POA: Diagnosis not present

## 2018-09-15 DIAGNOSIS — M546 Pain in thoracic spine: Secondary | ICD-10-CM | POA: Diagnosis not present

## 2018-09-15 DIAGNOSIS — M9901 Segmental and somatic dysfunction of cervical region: Secondary | ICD-10-CM | POA: Diagnosis not present

## 2018-09-15 DIAGNOSIS — M542 Cervicalgia: Secondary | ICD-10-CM | POA: Diagnosis not present

## 2018-09-15 DIAGNOSIS — M9902 Segmental and somatic dysfunction of thoracic region: Secondary | ICD-10-CM | POA: Diagnosis not present

## 2018-09-20 DIAGNOSIS — M9902 Segmental and somatic dysfunction of thoracic region: Secondary | ICD-10-CM | POA: Diagnosis not present

## 2018-09-20 DIAGNOSIS — M542 Cervicalgia: Secondary | ICD-10-CM | POA: Diagnosis not present

## 2018-09-20 DIAGNOSIS — M9901 Segmental and somatic dysfunction of cervical region: Secondary | ICD-10-CM | POA: Diagnosis not present

## 2018-09-20 DIAGNOSIS — M546 Pain in thoracic spine: Secondary | ICD-10-CM | POA: Diagnosis not present

## 2018-09-27 DIAGNOSIS — M9902 Segmental and somatic dysfunction of thoracic region: Secondary | ICD-10-CM | POA: Diagnosis not present

## 2018-09-27 DIAGNOSIS — M546 Pain in thoracic spine: Secondary | ICD-10-CM | POA: Diagnosis not present

## 2018-09-27 DIAGNOSIS — M9901 Segmental and somatic dysfunction of cervical region: Secondary | ICD-10-CM | POA: Diagnosis not present

## 2018-09-27 DIAGNOSIS — M542 Cervicalgia: Secondary | ICD-10-CM | POA: Diagnosis not present

## 2018-10-04 DIAGNOSIS — M9901 Segmental and somatic dysfunction of cervical region: Secondary | ICD-10-CM | POA: Diagnosis not present

## 2018-10-04 DIAGNOSIS — M546 Pain in thoracic spine: Secondary | ICD-10-CM | POA: Diagnosis not present

## 2018-10-04 DIAGNOSIS — M542 Cervicalgia: Secondary | ICD-10-CM | POA: Diagnosis not present

## 2018-10-04 DIAGNOSIS — M9902 Segmental and somatic dysfunction of thoracic region: Secondary | ICD-10-CM | POA: Diagnosis not present

## 2018-11-01 DIAGNOSIS — M542 Cervicalgia: Secondary | ICD-10-CM | POA: Diagnosis not present

## 2018-11-01 DIAGNOSIS — M9902 Segmental and somatic dysfunction of thoracic region: Secondary | ICD-10-CM | POA: Diagnosis not present

## 2018-11-01 DIAGNOSIS — M9901 Segmental and somatic dysfunction of cervical region: Secondary | ICD-10-CM | POA: Diagnosis not present

## 2018-11-01 DIAGNOSIS — M546 Pain in thoracic spine: Secondary | ICD-10-CM | POA: Diagnosis not present

## 2018-11-03 ENCOUNTER — Telehealth: Payer: 59 | Admitting: Physician Assistant

## 2018-11-03 DIAGNOSIS — J069 Acute upper respiratory infection, unspecified: Secondary | ICD-10-CM | POA: Diagnosis not present

## 2018-11-03 DIAGNOSIS — B9789 Other viral agents as the cause of diseases classified elsewhere: Secondary | ICD-10-CM | POA: Diagnosis not present

## 2018-11-03 MED ORDER — BENZONATATE 100 MG PO CAPS: 100.0000 mg | ORAL_CAPSULE | Freq: Three times a day (TID) | ORAL | 0 refills | Status: DC | PRN

## 2018-11-03 MED FILL — BENZONATATE 100 MG CAP: 100 | 10 days supply | Qty: 30 | Fill #0

## 2018-11-03 NOTE — Progress Notes (Signed)

## 2019-05-04 ENCOUNTER — Encounter: Payer: Self-pay | Admitting: Family Medicine

## 2019-05-26 ENCOUNTER — Encounter: Payer: 59 | Admitting: Family Medicine

## 2019-05-30 ENCOUNTER — Other Ambulatory Visit: Payer: Self-pay

## 2019-05-30 ENCOUNTER — Ambulatory Visit (INDEPENDENT_AMBULATORY_CARE_PROVIDER_SITE_OTHER): Payer: 59 | Admitting: Family Medicine

## 2019-05-30 ENCOUNTER — Encounter: Payer: Self-pay | Admitting: Family Medicine

## 2019-05-30 VITALS — BP 108/68 | HR 78 | Temp 97.6°F | Ht 74.0 in | Wt 193.5 lb

## 2019-05-30 DIAGNOSIS — R5383 Other fatigue: Secondary | ICD-10-CM

## 2019-05-30 DIAGNOSIS — E786 Lipoprotein deficiency: Secondary | ICD-10-CM

## 2019-05-30 DIAGNOSIS — Z0001 Encounter for general adult medical examination with abnormal findings: Secondary | ICD-10-CM | POA: Diagnosis not present

## 2019-05-30 LAB — CBC
HCT: 41.4 % (ref 39.0–52.0)
Hemoglobin: 14.6 g/dL (ref 13.0–17.0)
MCHC: 35.2 g/dL (ref 30.0–36.0)
MCV: 80 fl (ref 78.0–100.0)
Platelets: 175 10*3/uL (ref 150.0–400.0)
RBC: 5.18 Mil/uL (ref 4.22–5.81)
RDW: 14.3 % (ref 11.5–15.5)
WBC: 3.8 10*3/uL — ABNORMAL LOW (ref 4.0–10.5)

## 2019-05-30 LAB — COMPREHENSIVE METABOLIC PANEL
ALT: 20 U/L (ref 0–53)
AST: 15 U/L (ref 0–37)
Albumin: 4.1 g/dL (ref 3.5–5.2)
Alkaline Phosphatase: 93 U/L (ref 39–117)
BUN: 10 mg/dL (ref 6–23)
CO2: 31 mEq/L (ref 19–32)
Calcium: 9.6 mg/dL (ref 8.4–10.5)
Chloride: 102 mEq/L (ref 96–112)
Creatinine, Ser: 1.15 mg/dL (ref 0.40–1.50)
GFR: 72.13 mL/min (ref >60.00)
Glucose, Bld: 87 mg/dL (ref 70–99)
Potassium: 4 mEq/L (ref 3.5–5.1)
Sodium: 139 mEq/L (ref 135–145)
Total Bilirubin: 0.9 mg/dL (ref 0.2–1.2)
Total Protein: 6.6 g/dL (ref 6.0–8.3)

## 2019-05-30 LAB — VITAMIN D 25 HYDROXY (VIT D DEFICIENCY, FRACTURES): VITD: 15.76 ng/mL — ABNORMAL LOW (ref 30.00–100.00)

## 2019-05-30 LAB — LIPID PANEL
Cholesterol: 150 mg/dL (ref 0–200)
HDL: 33.8 mg/dL — ABNORMAL LOW (ref >39.00)
LDL Cholesterol: 97 mg/dL (ref 0–99)
NonHDL: 115.96
Total CHOL/HDL Ratio: 4
Triglycerides: 93 mg/dL (ref 0.0–149.0)
VLDL: 18.6 mg/dL (ref 0.0–40.0)

## 2019-05-30 LAB — VITAMIN B12: Vitamin B-12: 165 pg/mL — ABNORMAL LOW (ref 211–911)

## 2019-05-30 LAB — TSH: TSH: 1.9 u[IU]/mL (ref 0.35–4.50)

## 2019-05-30 MED ORDER — CHOLECALCIFEROL 1.25 MG (50000 UT) PO TABS: 50000.0000 [IU] | ORAL_TABLET | ORAL | 0 refills | Status: AC

## 2019-05-30 MED FILL — VITAMIN D3 50000 UNIT CAPS: 1.25 MG | 56 days supply | Qty: 8 | Fill #0

## 2019-05-30 NOTE — Addendum Note (Signed)
Addended by: Francis Dowse T on: 05/30/2019 08:20 AM   Modules accepted: Orders

## 2019-05-30 NOTE — Progress Notes (Signed)
Chief Complaint:  Dennis Grant is a 33 y.o. male who presents today for his annual comprehensive physical exam.    Assessment/Plan:  Low HDL Continue diet modifications.  Check lipid panel.  Fatigue Check CBC, C met, TSH, B12, and vitamin D.  Preventative Healthcare: Will get flu vaccine at work. Check CBC, C met, TSH.  Patient Counseling(The following topics were reviewed and/or handout was given):  -Nutrition: Stressed importance of moderation in sodium/caffeine intake, saturated fat and cholesterol, caloric balance, sufficient intake of fresh fruits, vegetables, and fiber.  -Stressed the importance of regular exercise.   -Substance Abuse: Discussed cessation/primary prevention of tobacco, alcohol, or other drug use; driving or other dangerous activities under the influence; availability of treatment for abuse.   -Injury prevention: Discussed safety belts, safety helmets, smoke detector, smoking near bedding or upholstery.   -Sexuality: Discussed sexually transmitted diseases, partner selection, use of condoms, avoidance of unintended pregnancy and contraceptive alternatives.   -Dental health: Discussed importance of regular tooth brushing, flossing, and dental visits.  -Health maintenance and immunizations reviewed. Please refer to Health maintenance section.  Return to care in 1 year for next preventative visit.     Subjective:  HPI:  He has no acute complaints today.   Has had some increasing fatigue. This has been going on for a few months. Feels like he is sleeping ok. Sleeping about 7-8 hours per night. Rides his stationary bike normally.   Lifestyle Diet: No specific diets.  Exercise: Rides stationary bike.  Depression screen [facility] 2/9 05/30/2019  Decreased Interest 0  Down, Depressed, Hopeless 0  PHQ - 2 Score 0  Altered sleeping 1  Tired, decreased energy 1  Change in appetite 0  Feeling bad or failure about yourself  0  Trouble concentrating 0  Moving slowly  or fidgety/restless 0  Suicidal thoughts 0  PHQ-9 Score 2  Difficult doing work/chores Not difficult at all    There are no preventive care reminders to display for this patient.   ROS: Per HPI, otherwise a complete review of systems was negative.   PMH:  The following were reviewed and entered/updated in epic: History reviewed. No pertinent past medical history. Patient Active Problem List   Diagnosis Date Noted  . Patellofemoral pain syndrome of right knee 05/08/2018  . PALPITATIONS 04/02/2010  . CHEST DISCOMFORT 03/31/2010   History reviewed. No pertinent surgical history.  Family History  Problem Relation Age of Onset  . Coronary artery disease Unknown        no premature cad or sudden death  . Hypertension Mother   . Cancer Maternal Aunt        Leukemia    Medications- reviewed and updated Current Outpatient Medications  Medication Sig Dispense Refill  . cetirizine (ZYRTEC) 10 MG tablet Take 10 mg by mouth daily.     No current facility-administered medications for this visit.     Allergies-reviewed and updated Allergies  Allergen Reactions  . Penicillins     Episode of flushing and sweating    Social History   Socioeconomic History  . Marital status: Single    Spouse name: Not on file  . Number of children: Not on file  . Years of education: Not on file  . Highest education level: Not on file  Occupational History  . Occupation: Part Time  Social Needs  . Financial resource strain: Not on file  . Food insecurity    Worry: Not on file    Inability: Not  on file  . Transportation needs    Medical: Not on file    Non-medical: Not on file  Tobacco Use  . Smoking status: Never Smoker  . Smokeless tobacco: Never Used  Substance and Sexual Activity  . Alcohol use: No  . Drug use: Never  . Sexual activity: Not on file  Lifestyle  . Physical activity    Days per week: Not on file    Minutes per session: Not on file  . Stress: Not on file   Relationships  . Social Herbalist on phone: Not on file    Gets together: Not on file    Attends religious service: Not on file    Active member of club or organization: Not on file    Attends meetings of clubs or organizations: Not on file    Relationship status: Not on file  Other Topics Concern  . Not on file  Social History Narrative  . Not on file        Objective:  Physical Exam: BP 108/68   Pulse 78   Temp 97.6 F (36.4 C)   Ht '6\' 2"'  (1.88 m)   Wt 193 lb 8 oz (87.8 kg)   SpO2 99%   BMI 24.84 kg/m   Body mass index is 24.84 kg/m. Wt Readings from Last 3 Encounters:  05/30/19 193 lb 8 oz (87.8 kg)  05/25/18 189 lb (85.7 kg)  05/03/18 191 lb 6.4 oz (86.8 kg)   Gen: NAD, resting comfortably HEENT: TMs normal bilaterally. OP clear. No thyromegaly noted.  CV: RRR with no murmurs appreciated Pulm: NWOB, CTAB with no crackles, wheezes, or rhonchi GI: Normal bowel sounds present. Soft, Nontender, Nondistended. MSK: no edema, cyanosis, or clubbing noted Skin: warm, dry Neuro: CN2-12 grossly intact. Strength 5/5 in upper and lower extremities. Reflexes symmetric and intact bilaterally.  Psych: Normal affect and thought content     Malikah Principato M. Jerline Pain, MD 05/30/2019 8:19 AM

## 2019-05-30 NOTE — Progress Notes (Signed)
Please inform patient of the following:  His "good" cholesterol is a bit low but stable compared to last year - would like for him to continue working on diet and exercise and we can recheck in a year.  His vitamin D and B12 are both low. This could explain some of his fatigue. Would like for him to start vitamin d 50000IU weekly and start on the b12 protocol. Would like to recheck both of these in 3-6 months.  Dennis Grant. Jerline Pain, MD 05/30/2019 11:44 AM

## 2019-05-30 NOTE — Patient Instructions (Signed)
It was very nice to see you today!  Keep up the good work!   We will check blood work.  Come back in 1 year for your next physical or sooner if needed.   Take care, Dr Jerline Pain  Please try these tips to maintain a healthy lifestyle:   Eat at least 3 REAL meals and 1-2 snacks per day.  Aim for no more than 5 hours between eating.  If you eat breakfast, please do so within one hour of getting up.    Obtain twice as many fruits/vegetables as protein or carbohydrate foods for both lunch and dinner. (Half of each meal should be fruits/vegetables, one quarter protein, and one quarter starchy carbs)   Cut down on sweet beverages. This includes juice, soda, and sweet tea.    Exercise at least 150 minutes every week.    Preventive Care 38-14 Years Old, Male Preventive care refers to lifestyle choices and visits with your health care provider that can promote health and wellness. This includes:  A yearly physical exam. This is also called an annual well check.  Regular dental and eye exams.  Immunizations.  Screening for certain conditions.  Healthy lifestyle choices, such as eating a healthy diet, getting regular exercise, not using drugs or products that contain nicotine and tobacco, and limiting alcohol use. What can I expect for my preventive care visit? Physical exam Your health care provider will check:  Height and weight. These may be used to calculate body mass index (BMI), which is a measurement that tells if you are at a healthy weight.  Heart rate and blood pressure.  Your skin for abnormal spots. Counseling Your health care provider may ask you questions about:  Alcohol, tobacco, and drug use.  Emotional well-being.  Home and relationship well-being.  Sexual activity.  Eating habits.  Work and work Statistician. What immunizations do I need?  Influenza (flu) vaccine  This is recommended every year. Tetanus, diphtheria, and pertussis (Tdap) vaccine   You may need a Td booster every 10 years. Varicella (chickenpox) vaccine  You may need this vaccine if you have not already been vaccinated. Human papillomavirus (HPV) vaccine  If recommended by your health care provider, you may need three doses over 6 months. Measles, mumps, and rubella (MMR) vaccine  You may need at least one dose of MMR. You may also need a second dose. Meningococcal conjugate (MenACWY) vaccine  One dose is recommended if you are 60-19 years old and a Market researcher living in a residence hall, or if you have one of several medical conditions. You may also need additional booster doses. Pneumococcal conjugate (PCV13) vaccine  You may need this if you have certain conditions and were not previously vaccinated. Pneumococcal polysaccharide (PPSV23) vaccine  You may need one or two doses if you smoke cigarettes or if you have certain conditions. Hepatitis A vaccine  You may need this if you have certain conditions or if you travel or work in places where you may be exposed to hepatitis A. Hepatitis B vaccine  You may need this if you have certain conditions or if you travel or work in places where you may be exposed to hepatitis B. Haemophilus influenzae type b (Hib) vaccine  You may need this if you have certain risk factors. You may receive vaccines as individual doses or as more than one vaccine together in one shot (combination vaccines). Talk with your health care provider about the risks and benefits of combination vaccines.  What tests do I need? Blood tests  Lipid and cholesterol levels. These may be checked every 5 years starting at age 92.  Hepatitis C test.  Hepatitis B test. Screening   Diabetes screening. This is done by checking your blood sugar (glucose) after you have not eaten for a while (fasting).  Sexually transmitted disease (STD) testing. Talk with your health care provider about your test results, treatment options, and if  necessary, the need for more tests. Follow these instructions at home: Eating and drinking   Eat a diet that includes fresh fruits and vegetables, whole grains, lean protein, and low-fat dairy products.  Take vitamin and mineral supplements as recommended by your health care provider.  Do not drink alcohol if your health care provider tells you not to drink.  If you drink alcohol: ? Limit how much you have to 0-2 drinks a day. ? Be aware of how much alcohol is in your drink. In the [place] one drink equals one 12 oz bottle of beer (355 mL), one 5 oz glass of wine (148 mL), or one 1 oz glass of hard liquor (44 mL). Lifestyle  Take daily care of your teeth and gums.  Stay active. Exercise for at least 30 minutes on 5 or more days each week.  Do not use any products that contain nicotine or tobacco, such as cigarettes, e-cigarettes, and chewing tobacco. If you need help quitting, ask your health care provider.  If you are sexually active, practice safe sex. Use a condom or other form of protection to prevent STIs (sexually transmitted infections). What's next?  Go to your health care provider once a year for a well check visit.  Ask your health care provider how often you should have your eyes and teeth checked.  Stay up to date on all vaccines. This information is not intended to replace advice given to you by your health care provider. Make sure you discuss any questions you have with your health care provider. Document Released: 10/20/2001 Document Revised: 08/18/2018 Document Reviewed: 08/18/2018 Elsevier Patient Education  2020 [employer].

## 2019-05-31 ENCOUNTER — Ambulatory Visit (INDEPENDENT_AMBULATORY_CARE_PROVIDER_SITE_OTHER): Payer: 59

## 2019-05-31 DIAGNOSIS — E538 Deficiency of other specified B group vitamins: Secondary | ICD-10-CM | POA: Diagnosis not present

## 2019-05-31 MED ORDER — CYANOCOBALAMIN 1000 MCG/ML IJ SOLN
1000.0000 ug | Freq: Once | INTRAMUSCULAR | Status: AC
  Administered 2019-05-31: 1000 ug via INTRAMUSCULAR

## 2019-05-31 NOTE — Progress Notes (Signed)
Per orders of Dr.Parker, injection of Vitamin B 12 given by Peyton Rossner. Patient tolerated injection well.  

## 2019-06-06 ENCOUNTER — Other Ambulatory Visit: Payer: Self-pay

## 2019-06-06 ENCOUNTER — Ambulatory Visit (INDEPENDENT_AMBULATORY_CARE_PROVIDER_SITE_OTHER): Payer: 59

## 2019-06-06 DIAGNOSIS — E538 Deficiency of other specified B group vitamins: Secondary | ICD-10-CM

## 2019-06-06 MED ORDER — CYANOCOBALAMIN 1000 MCG/ML IJ SOLN
1000.0000 ug | Freq: Once | INTRAMUSCULAR | Status: AC
  Administered 2019-06-06: 1000 ug via INTRAMUSCULAR

## 2019-06-06 NOTE — Progress Notes (Signed)
Per orders of Dr. Hunter, injection of Vitamin b12, 1000 mcg given right deltoid IM by Jennifer M Zellmer, CMA Patient tolerated injection well.  He will return in 1 week for his next injection.    

## 2019-06-07 NOTE — Progress Notes (Signed)
I have reviewed and agree with note, evaluation, plan.   Stephen Hunter, MD  

## 2019-06-14 ENCOUNTER — Ambulatory Visit: Payer: 59

## 2019-06-16 ENCOUNTER — Telehealth: Payer: Self-pay

## 2019-06-16 NOTE — Telephone Encounter (Signed)
Noted  

## 2019-06-16 NOTE — Telephone Encounter (Signed)
Copied from [place] [id number]. Topic: General - Other >> Jun 16, 2019 11:53 AM Leward Quan A wrote: Reason for CRM: Patient called to say that covid test result was negative so he will be ready for his upcoming appointment.

## 2019-06-21 ENCOUNTER — Ambulatory Visit (INDEPENDENT_AMBULATORY_CARE_PROVIDER_SITE_OTHER): Payer: 59

## 2019-06-21 ENCOUNTER — Other Ambulatory Visit: Payer: Self-pay

## 2019-06-21 DIAGNOSIS — E538 Deficiency of other specified B group vitamins: Secondary | ICD-10-CM | POA: Diagnosis not present

## 2019-06-21 MED ORDER — CYANOCOBALAMIN 1000 MCG/ML IJ SOLN
1000.0000 ug | Freq: Once | INTRAMUSCULAR | Status: AC
  Administered 2019-06-21: 1000 ug via INTRAMUSCULAR

## 2019-06-21 MED ORDER — CYANOCOBALAMIN 1000 MCG/ML IJ SOLN: 1000.0000 ug | Freq: Once | INTRAMUSCULAR | Status: DC

## 2019-06-21 NOTE — Progress Notes (Signed)
Per orders of Dr. Parker, injection of B12 given by Joellen Y Thompson in left deltoid. Patient tolerated injection well. Patient will make appointment for 1 week.   

## 2019-06-28 ENCOUNTER — Ambulatory Visit (INDEPENDENT_AMBULATORY_CARE_PROVIDER_SITE_OTHER): Payer: 59

## 2019-06-28 ENCOUNTER — Other Ambulatory Visit: Payer: Self-pay

## 2019-06-28 DIAGNOSIS — E538 Deficiency of other specified B group vitamins: Secondary | ICD-10-CM | POA: Diagnosis not present

## 2019-06-28 MED ORDER — CYANOCOBALAMIN 1000 MCG/ML IJ SOLN
1000.0000 ug | Freq: Once | INTRAMUSCULAR | Status: AC
  Administered 2019-06-28: 08:00:00 1000 ug via INTRAMUSCULAR

## 2019-06-28 NOTE — Progress Notes (Signed)
Per orders of Dr. Parker, injection of vitamin B12 1000 mcg given in right deltoid by Arianni Gallego, CMA.  Patient tolerated injection well. 

## 2019-07-05 NOTE — Addendum Note (Signed)
Addended by: Francella Solian on: 07/05/2019 09:24 PM   Modules accepted: Level of Service

## 2019-07-20 DIAGNOSIS — H5213 Myopia, bilateral: Secondary | ICD-10-CM | POA: Diagnosis not present

## 2019-07-26 ENCOUNTER — Ambulatory Visit (INDEPENDENT_AMBULATORY_CARE_PROVIDER_SITE_OTHER): Payer: 59

## 2019-07-26 ENCOUNTER — Other Ambulatory Visit: Payer: Self-pay

## 2019-07-26 DIAGNOSIS — E538 Deficiency of other specified B group vitamins: Secondary | ICD-10-CM | POA: Diagnosis not present

## 2019-07-26 MED ORDER — CYANOCOBALAMIN 1000 MCG/ML IJ SOLN
1000.0000 ug | Freq: Once | INTRAMUSCULAR | Status: AC
  Administered 2019-07-26: 1000 ug via INTRAMUSCULAR

## 2019-07-26 NOTE — Progress Notes (Signed)
Per orders of Dr. Parker, injection of B-12 given by Joellen Y Thompson in right deltoid. Patient tolerated injection well. Patient will make appointment for 1 month.  

## 2019-08-23 ENCOUNTER — Other Ambulatory Visit: Payer: Self-pay

## 2019-08-23 ENCOUNTER — Ambulatory Visit (INDEPENDENT_AMBULATORY_CARE_PROVIDER_SITE_OTHER): Payer: 59

## 2019-08-23 DIAGNOSIS — E538 Deficiency of other specified B group vitamins: Secondary | ICD-10-CM | POA: Diagnosis not present

## 2019-08-23 MED ORDER — CYANOCOBALAMIN 1000 MCG/ML IJ SOLN
1000.0000 ug | Freq: Once | INTRAMUSCULAR | Status: AC
  Administered 2019-08-23: 1000 ug via INTRAMUSCULAR

## 2019-08-23 NOTE — Progress Notes (Signed)
Per orders of Dr. Parker, injection of B-12 given by Kemara Quigley Y Estefan Pattison in right deltoid. Patient tolerated injection well. Patient will make appointment for 1 month.  

## 2019-09-27 ENCOUNTER — Ambulatory Visit (INDEPENDENT_AMBULATORY_CARE_PROVIDER_SITE_OTHER): Payer: 59

## 2019-09-27 ENCOUNTER — Other Ambulatory Visit: Payer: Self-pay

## 2019-09-27 DIAGNOSIS — E538 Deficiency of other specified B group vitamins: Secondary | ICD-10-CM | POA: Diagnosis not present

## 2019-09-27 MED ORDER — CYANOCOBALAMIN 1000 MCG/ML IJ SOLN
1000.0000 ug | Freq: Once | INTRAMUSCULAR | Status: AC
  Administered 2019-09-27: 1000 ug via INTRAMUSCULAR

## 2019-09-27 NOTE — Progress Notes (Signed)
Per orders of Dr.Parker, injection of Vitamin B 12 given by Demetress Tift. Patient tolerated injection well.  

## 2019-10-31 DIAGNOSIS — M9902 Segmental and somatic dysfunction of thoracic region: Secondary | ICD-10-CM | POA: Diagnosis not present

## 2019-10-31 DIAGNOSIS — M542 Cervicalgia: Secondary | ICD-10-CM | POA: Diagnosis not present

## 2019-10-31 DIAGNOSIS — M62838 Other muscle spasm: Secondary | ICD-10-CM | POA: Diagnosis not present

## 2019-10-31 DIAGNOSIS — M9901 Segmental and somatic dysfunction of cervical region: Secondary | ICD-10-CM | POA: Diagnosis not present

## 2019-11-01 DIAGNOSIS — M542 Cervicalgia: Secondary | ICD-10-CM | POA: Diagnosis not present

## 2019-11-01 DIAGNOSIS — M9901 Segmental and somatic dysfunction of cervical region: Secondary | ICD-10-CM | POA: Diagnosis not present

## 2019-11-01 DIAGNOSIS — M62838 Other muscle spasm: Secondary | ICD-10-CM | POA: Diagnosis not present

## 2019-11-01 DIAGNOSIS — M9902 Segmental and somatic dysfunction of thoracic region: Secondary | ICD-10-CM | POA: Diagnosis not present

## 2019-11-06 DIAGNOSIS — M542 Cervicalgia: Secondary | ICD-10-CM | POA: Diagnosis not present

## 2019-11-06 DIAGNOSIS — M9902 Segmental and somatic dysfunction of thoracic region: Secondary | ICD-10-CM | POA: Diagnosis not present

## 2019-11-06 DIAGNOSIS — M9901 Segmental and somatic dysfunction of cervical region: Secondary | ICD-10-CM | POA: Diagnosis not present

## 2019-11-06 DIAGNOSIS — M62838 Other muscle spasm: Secondary | ICD-10-CM | POA: Diagnosis not present

## 2019-11-08 DIAGNOSIS — M62838 Other muscle spasm: Secondary | ICD-10-CM | POA: Diagnosis not present

## 2019-11-08 DIAGNOSIS — M9901 Segmental and somatic dysfunction of cervical region: Secondary | ICD-10-CM | POA: Diagnosis not present

## 2019-11-08 DIAGNOSIS — M9902 Segmental and somatic dysfunction of thoracic region: Secondary | ICD-10-CM | POA: Diagnosis not present

## 2019-11-08 DIAGNOSIS — M542 Cervicalgia: Secondary | ICD-10-CM | POA: Diagnosis not present

## 2019-11-15 DIAGNOSIS — M9901 Segmental and somatic dysfunction of cervical region: Secondary | ICD-10-CM | POA: Diagnosis not present

## 2019-11-15 DIAGNOSIS — M542 Cervicalgia: Secondary | ICD-10-CM | POA: Diagnosis not present

## 2019-11-15 DIAGNOSIS — M9902 Segmental and somatic dysfunction of thoracic region: Secondary | ICD-10-CM | POA: Diagnosis not present

## 2019-11-15 DIAGNOSIS — M62838 Other muscle spasm: Secondary | ICD-10-CM | POA: Diagnosis not present

## 2020-03-04 ENCOUNTER — Encounter: Payer: Self-pay | Admitting: Family Medicine

## 2020-03-08 ENCOUNTER — Telehealth: Payer: Self-pay | Admitting: Family Medicine

## 2020-03-08 NOTE — Telephone Encounter (Signed)
Nurse Assessment Nurse: [name], RN, [name] Date/Time Dennis Grant Time): 03/08/2020 3:02:19 PM Confirm and document reason for call. If symptomatic, describe symptoms. ---Caller states that he has blood in stool after BM. No clots. Bright red Has the patient had close contact with a person known or suspected to have the novel coronavirus illness OR traveled / lives in area with major community spread (including international travel) in the last 14 days from the onset of symptoms? * If Asymptomatic, screen for exposure and travel within the last 14 days. ---No Does the patient have any new or worsening symptoms? ---Yes Will a triage be completed? ---Yes Related visit to physician within the last 2 weeks? ---No Does the PT have any chronic conditions? (i.e. diabetes, asthma, this includes High risk factors for pregnancy, etc.) ---Yes List chronic conditions. ---allergies Is this a behavioral health or substance abuse call? ---No Guidelines Guideline Title Affirmed Question Affirmed Notes Nurse Date/Time Dennis Grant Time) Rectal Bleeding MILD rectal bleeding (more than just a few drops or streaks) [name], RN, [name] 03/08/2020 3:03:20 PM Disp. Time Dennis Grant Time) Disposition Final User 03/08/2020 3:05:29 PM SEE PCP WITHIN 3 DAYS Yes [name], RN, TeresaPLEASE NOTE: All timestamps contained within this report are represented as [place] Standard Time. CONFIDENTIALTY NOTICE: This fax transmission is intended only for the addressee. It contains information that is legally privileged, confidential or otherwise protected from use or disclosure. If you are not the intended recipient, you are strictly prohibited from reviewing, disclosing, copying using or disseminating any of this information or taking any action in reliance on or regarding this information. If you have received this fax in error, please notify us immediately by telephone so that we can arrange for its return to [place]. Phone: [phone],  Toll-Free: [phone], Fax: [phone] Page: 2 of 2 Call Id: [id number] Caller Disagree/Comply Comply Caller Understands Yes PreDisposition Call Doctor Care Advice Given Per Guideline SEE PCP WITHIN 3 DAYS: WARM SALINE SITZ BATHS - TWICE DAILY FOR RECTAL SYMPTOMS: * Sit in a warm sitz bath for 20 minutes twice a day. This will decrease swelling and irritation, keep the area clean, and help with healing. * Afterwards, pat area dry with unscented toilet paper. WARM SALINE SITZ BATH - HOW TO MAKE A SITZ BATH: * Here is how you can make a saline sitz bath. * Fill the tub with warm water until it is 3-4 inches (7-10 cm) deep. * Add 1/4 cup (80 g) of table salt or baking soda to a tub of warm water. Stir the water until it dissolves. TO SOFTEN STOOLS AND TREAT CONSTIPATION: * Eat a high fiber diet. * Drink adequate liquids (6-8 glasses of water a day) * Try to eat fresh fruit and vegetables at each meal (peas, prunes, citrus, apples, beans, corn). * Eat more grain foods (bran flakes, bran muffins, graham crackers, oatmeal, brown rice, and whole wheat bread). Popcorn is a source of fiber. * A high fiber diet will help improve your intestinal function and soften your BM's. The fiber works by holding more water in your stools. HIGH FIBER DIET: CALL BACK IF: * Dizziness occurs * Bleeding increases * You become worse. CARE ADVICE given per Rectal Bleeding (Adult) guideline. Referrals Warm transfer to backline

## 2020-03-12 ENCOUNTER — Other Ambulatory Visit: Payer: Self-pay

## 2020-03-12 ENCOUNTER — Encounter: Payer: Self-pay | Admitting: Physician Assistant

## 2020-03-12 ENCOUNTER — Ambulatory Visit: Payer: 59 | Admitting: Physician Assistant

## 2020-03-12 VITALS — BP 110/82 | HR 73 | Temp 97.3°F | Ht 74.0 in | Wt 184.2 lb

## 2020-03-12 DIAGNOSIS — K625 Hemorrhage of anus and rectum: Secondary | ICD-10-CM | POA: Diagnosis not present

## 2020-03-12 NOTE — Progress Notes (Signed)
Dennis Grant is a 34 y.o. male here for a new problem.  I acted as a Neurosurgeon for [employer], PA-C [employer], [place]  History of Present Illness:   Chief Complaint  Patient presents with   Blood In Stools    HPI   Hematochezia Pt noticed blood in stool about 2 weeks ago. Symptoms started after a brief spell of constipation. He started taking taking a probiotic which helped with constipation, did not require use of stool softener or laxative. At times he was having episodes of "just dripping red blood." He had been dealing with a hemorrhoid that seemed to be bigger than normal  He has not seen blood in stools in 3 days. He has been intentionally losing weight to help with his symptoms with lifestyle changes.  Denies: severe abdominal pain, dark/tarry stools, excessive use of NSAIDs or ASA, personal or fam hx of IBD/colon cancer, lightheadedness, dizziness, pre-syncope    Social History   Tobacco Use   Smoking status: Never Smoker   Smokeless tobacco: Never Used  Substance Use Topics   Alcohol use: No   Drug use: Never    History reviewed. No pertinent surgical history.  Family History  Problem Relation Age of Onset   Coronary artery disease Other        no premature cad or sudden death   Hypertension Mother    Cancer Maternal Aunt        Leukemia    Allergies  Allergen Reactions   Penicillins     Episode of flushing and sweating    Current Medications:   Current Outpatient Medications:    cetirizine (ZYRTEC) 10 MG tablet, Take 10 mg by mouth daily., Disp: , Rfl:    Multiple Vitamin (MULTIVITAMIN) tablet, Take 1 tablet by mouth daily., Disp: , Rfl:    Probiotic Product (CULTURELLE PROBIOTICS PO), Take by mouth., Disp: , Rfl:    Review of Systems:   ROS Negative unless otherwise specified per HPI.  Vitals:   Vitals:   03/12/20 1117  BP: 110/82  Pulse: 73  Temp: (!) 97.3 F (36.3 C)  TempSrc: Temporal  SpO2: 98%  Weight: 184 lb 3.2 oz  (83.6 kg)  Height: 6\' 2"  (1.88 m)     Body mass index is 23.65 kg/m.  Physical Exam:   Physical Exam Vitals and nursing note reviewed.  Constitutional:      General: He is not in acute distress.    Appearance: He is well-developed. He is not ill-appearing or toxic-appearing.  Cardiovascular:     Rate and Rhythm: Normal rate and regular rhythm.     Pulses: Normal pulses.     Heart sounds: Normal heart sounds, S1 normal and S2 normal.     Comments: No LE edema Pulmonary:     Effort: Pulmonary effort is normal.     Breath sounds: Normal breath sounds.  Skin:    General: Skin is warm and dry.  Neurological:     Mental Status: He is alert.     GCS: GCS eye subscore is 4. GCS verbal subscore is 5. GCS motor subscore is 6.  Psychiatric:        Speech: Speech normal.        Behavior: Behavior normal. Behavior is cooperative.      Assessment and Plan:   Dennis Grant was seen today for blood in stools.  Diagnoses and all orders for this visit:  Rectal bleeding -     Ambulatory referral to Gastroenterology  Referral to GI for further evaluation and management. Worsening precautions advised. Constipation prevention discussed. Declined rx for hemorrhoid.   Reviewed expectations re: course of current medical issues.  Discussed self-management of symptoms.  Outlined signs and symptoms indicating need for more acute intervention.  Patient verbalized understanding and all questions were answered.  See orders for this visit as documented in the electronic medical record.  Patient received an After-Visit Summary.  CMA or LPN served as scribe during this visit. History, Physical, and Plan performed by medical provider. The above documentation has been reviewed and is accurate and complete.  Jarold Motto, PA-C

## 2020-03-12 NOTE — Patient Instructions (Signed)
Rectal Bleeding  Rectal bleeding is when blood passes out of the anus. People with rectal bleeding may notice bright red blood in their underwear or in the toilet after having a bowel movement. They may also have dark red or black stools. Rectal bleeding is usually a sign that something is wrong. Many things can cause rectal bleeding, including:  Hemorrhoids. These are blood vessels in the anus or rectum that are larger than normal.  Fistulas. These are abnormal passages in the rectum and anus.  Anal fissures. This is a tear in the anus.  Diverticulosis. This is a condition in which pockets or sacs project from the bowel.  Proctitis and colitis. These are conditions in which the rectum, colon, or anus become inflamed.  Polyps. These are growths that can be cancerous (malignant) or non-cancerous (benign).  Part of the rectum sticking out from the anus (rectal prolapse).  Certain medicines.  Intestinal infections. Follow these instructions at home: Pay attention to any changes in your symptoms. Take these actions to help lessen bleeding and discomfort:  Eat a diet that is high in fiber. This will keep your stool soft, making it easier to pass stools without straining. Ask your health care provider what foods and drinks are high in fiber.  Drink enough fluid to keep your urine clear or pale yellow. This also helps to keep your stool soft.  Try taking a warm bath. This may help soothe any pain in your rectum.  Keep all follow-up visits as told by your health care provider. This is important. Get help right away if:  You have new or increased rectal bleeding.  You have black or dark red stools.  You vomit blood or something that looks like coffee grounds.  You have pain or tenderness in your abdomen.  You have a fever.  You feel weak.  You feel nauseous.  You faint.  You have severe pain in your rectum.  You cannot have a bowel movement. This information is not  intended to replace advice given to you by your health care provider. Make sure you discuss any questions you have with your health care provider. Document Revised: 04/16/2016 Document Reviewed: 10/20/2015 Elsevier Patient Education  2020 Elsevier Inc.  

## 2020-03-12 NOTE — Telephone Encounter (Signed)
FYI Patient scheduled with Bufford Buttner

## 2020-03-14 ENCOUNTER — Encounter: Payer: Self-pay | Admitting: Gastroenterology

## 2020-03-19 ENCOUNTER — Ambulatory Visit: Payer: 59 | Admitting: Physician Assistant

## 2020-05-14 ENCOUNTER — Ambulatory Visit: Payer: 59 | Admitting: Gastroenterology

## 2020-05-30 ENCOUNTER — Encounter: Payer: 59 | Admitting: Family Medicine

## 2020-06-05 ENCOUNTER — Encounter: Payer: Self-pay | Admitting: Family Medicine

## 2020-06-05 ENCOUNTER — Other Ambulatory Visit: Payer: Self-pay

## 2020-06-05 ENCOUNTER — Ambulatory Visit (INDEPENDENT_AMBULATORY_CARE_PROVIDER_SITE_OTHER): Payer: 59 | Admitting: Family Medicine

## 2020-06-05 VITALS — BP 102/70 | HR 77 | Temp 97.9°F | Ht 74.0 in | Wt 185.0 lb

## 2020-06-05 DIAGNOSIS — Z23 Encounter for immunization: Secondary | ICD-10-CM | POA: Diagnosis not present

## 2020-06-05 DIAGNOSIS — Z0001 Encounter for general adult medical examination with abnormal findings: Secondary | ICD-10-CM | POA: Diagnosis not present

## 2020-06-05 DIAGNOSIS — E538 Deficiency of other specified B group vitamins: Secondary | ICD-10-CM

## 2020-06-05 DIAGNOSIS — E785 Hyperlipidemia, unspecified: Secondary | ICD-10-CM

## 2020-06-05 DIAGNOSIS — E559 Vitamin D deficiency, unspecified: Secondary | ICD-10-CM

## 2020-06-05 NOTE — Assessment & Plan Note (Signed)
Check vitamin D level 

## 2020-06-05 NOTE — Assessment & Plan Note (Signed)
Check B12 

## 2020-06-05 NOTE — Assessment & Plan Note (Signed)
Continue lifestyle modifications.  Check lipid panel, CBC, CMET, TSH.

## 2020-06-05 NOTE — Patient Instructions (Signed)
It was very nice to see you today!  We will check blood work today.  We give your flu vaccine today.  Please come back in year for your next physical.  Come back to me sooner if needed.  Take care, Dr Jerline Pain  Please try these tips to maintain a healthy lifestyle:   Eat at least 3 REAL meals and 1-2 snacks per day.  Aim for no more than 5 hours between eating.  If you eat breakfast, please do so within one hour of getting up.    Each meal should contain half fruits/vegetables, one quarter protein, and one quarter carbs (no bigger than a computer mouse)   Cut down on sweet beverages. This includes juice, soda, and sweet tea.     Drink at least 1 glass of water with each meal and aim for at least 8 glasses per day   Exercise at least 150 minutes every week.    Preventive Care 46-29 Years Old, Male Preventive care refers to lifestyle choices and visits with your health care provider that can promote health and wellness. This includes:  A yearly physical exam. This is also called an annual well check.  Regular dental and eye exams.  Immunizations.  Screening for certain conditions.  Healthy lifestyle choices, such as eating a healthy diet, getting regular exercise, not using drugs or products that contain nicotine and tobacco, and limiting alcohol use. What can I expect for my preventive care visit? Physical exam Your health care provider will check:  Height and weight. These may be used to calculate body mass index (BMI), which is a measurement that tells if you are at a healthy weight.  Heart rate and blood pressure.  Your skin for abnormal spots. Counseling Your health care provider may ask you questions about:  Alcohol, tobacco, and drug use.  Emotional well-being.  Home and relationship well-being.  Sexual activity.  Eating habits.  Work and work Statistician. What immunizations do I need?  Influenza (flu) vaccine  This is recommended every  year. Tetanus, diphtheria, and pertussis (Tdap) vaccine  You may need a Td booster every 10 years. Varicella (chickenpox) vaccine  You may need this vaccine if you have not already been vaccinated. Human papillomavirus (HPV) vaccine  If recommended by your health care provider, you may need three doses over 6 months. Measles, mumps, and rubella (MMR) vaccine  You may need at least one dose of MMR. You may also need a second dose. Meningococcal conjugate (MenACWY) vaccine  One dose is recommended if you are 10-57 years old and a Market researcher living in a residence hall, or if you have one of several medical conditions. You may also need additional booster doses. Pneumococcal conjugate (PCV13) vaccine  You may need this if you have certain conditions and were not previously vaccinated. Pneumococcal polysaccharide (PPSV23) vaccine  You may need one or two doses if you smoke cigarettes or if you have certain conditions. Hepatitis A vaccine  You may need this if you have certain conditions or if you travel or work in places where you may be exposed to hepatitis A. Hepatitis B vaccine  You may need this if you have certain conditions or if you travel or work in places where you may be exposed to hepatitis B. Haemophilus influenzae type b (Hib) vaccine  You may need this if you have certain risk factors. You may receive vaccines as individual doses or as more than one vaccine together in one shot (combination  vaccines). Talk with your health care provider about the risks and benefits of combination vaccines. What tests do I need? Blood tests  Lipid and cholesterol levels. These may be checked every 5 years starting at age 59.  Hepatitis C test.  Hepatitis B test. Screening   Diabetes screening. This is done by checking your blood sugar (glucose) after you have not eaten for a while (fasting).  Sexually transmitted disease (STD) testing. Talk with your health care  provider about your test results, treatment options, and if necessary, the need for more tests. Follow these instructions at home: Eating and drinking   Eat a diet that includes fresh fruits and vegetables, whole grains, lean protein, and low-fat dairy products.  Take vitamin and mineral supplements as recommended by your health care provider.  Do not drink alcohol if your health care provider tells you not to drink.  If you drink alcohol: ? Limit how much you have to 0-2 drinks a day. ? Be aware of how much alcohol is in your drink. In the [place] one drink equals one 12 oz bottle of beer (355 mL), one 5 oz glass of wine (148 mL), or one 1 oz glass of hard liquor (44 mL). Lifestyle  Take daily care of your teeth and gums.  Stay active. Exercise for at least 30 minutes on 5 or more days each week.  Do not use any products that contain nicotine or tobacco, such as cigarettes, e-cigarettes, and chewing tobacco. If you need help quitting, ask your health care provider.  If you are sexually active, practice safe sex. Use a condom or other form of protection to prevent STIs (sexually transmitted infections). What's next?  Go to your health care provider once a year for a well check visit.  Ask your health care provider how often you should have your eyes and teeth checked.  Stay up to date on all vaccines. This information is not intended to replace advice given to you by your health care provider. Make sure you discuss any questions you have with your health care provider. Document Revised: 08/18/2018 Document Reviewed: 08/18/2018 Elsevier Patient Education  2020 [employer].

## 2020-06-05 NOTE — Progress Notes (Signed)
Chief Complaint:  Dennis Grant is a 34 y.o. male who presents today for his annual comprehensive physical exam.    Assessment/Plan:  Chronic Problems Addressed Today: Dyslipidemia Continue lifestyle modifications.  Check lipid panel, CBC, CMET, TSH.  Vitamin D deficiency Check vitamin D level.  B12 deficiency Check B12.  Preventative Healthcare: Flu vaccine given today.  Check CBC, CMET, TSH, lipid panel.  Patient Counseling(The following topics were reviewed and/or handout was given):  -Nutrition: Stressed importance of moderation in sodium/caffeine intake, saturated fat and cholesterol, caloric balance, sufficient intake of fresh fruits, vegetables, and fiber.  -Stressed the importance of regular exercise.   -Substance Abuse: Discussed cessation/primary prevention of tobacco, alcohol, or other drug use; driving or other dangerous activities under the influence; availability of treatment for abuse.   -Injury prevention: Discussed safety belts, safety helmets, smoke detector, smoking near bedding or upholstery.   -Sexuality: Discussed sexually transmitted diseases, partner selection, use of condoms, avoidance of unintended pregnancy and contraceptive alternatives.   -Dental health: Discussed importance of regular tooth brushing, flossing, and dental visits.  -Health maintenance and immunizations reviewed. Please refer to Health maintenance section.  Return to care in 1 year for next preventative visit.     Subjective:  HPI:  He has no acute complaints today.   Lifestyle Diet: Balanced.  Exercise: Limited.   Depression screen [facility] 2/9 05/30/2019  Decreased Interest 0  Down, Depressed, Hopeless 0  PHQ - 2 Score 0  Altered sleeping 1  Tired, decreased energy 1  Change in appetite 0  Feeling bad or failure about yourself  0  Trouble concentrating 0  Moving slowly or fidgety/restless 0  Suicidal thoughts 0  PHQ-9 Score 2  Difficult doing work/chores Not difficult at  all    Health Maintenance Due  Topic Date Due  . Hepatitis C Screening  Never done     ROS: Per HPI, otherwise a complete review of systems was negative.   PMH:  The following were reviewed and entered/updated in epic: History reviewed. No pertinent past medical history. Patient Active Problem List   Diagnosis Date Noted  . B12 deficiency 06/05/2020  . Vitamin D deficiency 06/05/2020  . Dyslipidemia 06/05/2020   History reviewed. No pertinent surgical history.  Family History  Problem Relation Age of Onset  . Coronary artery disease Other        no premature cad or sudden death  . Hypertension Mother   . Cancer Maternal Aunt        Leukemia    Medications- reviewed and updated Current Outpatient Medications  Medication Sig Dispense Refill  . cetirizine (ZYRTEC) 10 MG tablet Take 10 mg by mouth daily. (Patient not taking: Reported on 06/05/2020)    . Multiple Vitamin (MULTIVITAMIN) tablet Take 1 tablet by mouth daily. (Patient not taking: Reported on 06/05/2020)    . Probiotic Product (CULTURELLE PROBIOTICS PO) Take by mouth. (Patient not taking: Reported on 06/05/2020)     No current facility-administered medications for this visit.    Allergies-reviewed and updated Allergies  Allergen Reactions  . Penicillins     Episode of flushing and sweating    Social History   Socioeconomic History  . Marital status: Single    Spouse name: Not on file  . Number of children: Not on file  . Years of education: Not on file  . Highest education level: Not on file  Occupational History  . Occupation: Part Time  Tobacco Use  . Smoking status: Never Smoker  .  Smokeless tobacco: Never Used  Substance and Sexual Activity  . Alcohol use: No  . Drug use: Never  . Sexual activity: Not on file  Other Topics Concern  . Not on file  Social History Narrative  . Not on file   Social Determinants of Health   Financial Resource Strain:   . Difficulty of Paying Living Expenses:  Not on file  Food Insecurity:   . Worried About Programme researcher, broadcasting/film/video in the Last Year: Not on file  . Ran Out of Food in the Last Year: Not on file  Transportation Needs:   . Lack of Transportation (Medical): Not on file  . Lack of Transportation (Non-Medical): Not on file  Physical Activity:   . Days of Exercise per Week: Not on file  . Minutes of Exercise per Session: Not on file  Stress:   . Feeling of Stress : Not on file  Social Connections:   . Frequency of Communication with Friends and Family: Not on file  . Frequency of Social Gatherings with Friends and Family: Not on file  . Attends Religious Services: Not on file  . Active Member of Clubs or Organizations: Not on file  . Attends Banker Meetings: Not on file  . Marital Status: Not on file        Objective:  Physical Exam: BP 102/70   Pulse 77   Temp 97.9 F (36.6 C) (Temporal)   Ht 6\' 2"  (1.88 m)   Wt 185 lb (83.9 kg)   SpO2 99%   BMI 23.75 kg/m   Body mass index is 23.75 kg/m. Wt Readings from Last 3 Encounters:  06/05/20 185 lb (83.9 kg)  03/12/20 184 lb 3.2 oz (83.6 kg)  05/30/19 193 lb 8 oz (87.8 kg)   Gen: NAD, resting comfortably HEENT: TMs normal bilaterally. OP clear. No thyromegaly noted.  CV: RRR with no murmurs appreciated Pulm: NWOB, CTAB with no crackles, wheezes, or rhonchi GI: Normal bowel sounds present. Soft, Nontender, Nondistended. MSK: no edema, cyanosis, or clubbing noted Skin: warm, dry Neuro: CN2-12 grossly intact. Strength 5/5 in upper and lower extremities. Reflexes symmetric and intact bilaterally.  Psych: Normal affect and thought content     Bradely Rudin M. 06/01/19, MD 06/05/2020 10:00 AM

## 2020-06-06 LAB — COMPREHENSIVE METABOLIC PANEL
AG Ratio: 1.8 (calc) (ref 1.0–2.5)
ALT: 15 U/L (ref 9–46)
AST: 15 U/L (ref 10–40)
Albumin: 4.3 g/dL (ref 3.6–5.1)
Alkaline phosphatase (APISO): 104 U/L (ref 36–130)
BUN: 9 mg/dL (ref 7–25)
CO2: 30 mmol/L (ref 20–32)
Calcium: 9.2 mg/dL (ref 8.6–10.3)
Chloride: 102 mmol/L (ref 98–110)
Creat: 1.1 mg/dL (ref 0.60–1.35)
Globulin: 2.4 g/dL (calc) (ref 1.9–3.7)
Glucose, Bld: 83 mg/dL (ref 65–99)
Potassium: 4.1 mmol/L (ref 3.5–5.3)
Sodium: 139 mmol/L (ref 135–146)
Total Bilirubin: 0.9 mg/dL (ref 0.2–1.2)
Total Protein: 6.7 g/dL (ref 6.1–8.1)

## 2020-06-06 LAB — VITAMIN B12: Vitamin B-12: 294 pg/mL (ref 200–1100)

## 2020-06-06 LAB — CBC
HCT: 42.8 % (ref 38.5–50.0)
Hemoglobin: 14.7 g/dL (ref 13.2–17.1)
MCH: 28.2 pg (ref 27.0–33.0)
MCHC: 34.3 g/dL (ref 32.0–36.0)
MCV: 82.1 fL (ref 80.0–100.0)
MPV: 10.3 fL (ref 7.5–12.5)
Platelets: 162 10*3/uL (ref 140–400)
RBC: 5.21 10*6/uL (ref 4.20–5.80)
RDW: 13 % (ref 11.0–15.0)
WBC: 3.7 10*3/uL — ABNORMAL LOW (ref 3.8–10.8)

## 2020-06-06 LAB — LIPID PANEL
Cholesterol: 156 mg/dL (ref <200)
HDL: 40 mg/dL (ref >=40)
LDL Cholesterol (Calc): 95 mg/dL (calc)
Non-HDL Cholesterol (Calc): 116 mg/dL (calc) (ref <130)
Total CHOL/HDL Ratio: 3.9 (calc) (ref <5.0)
Triglycerides: 112 mg/dL (ref <150)

## 2020-06-06 LAB — VITAMIN D 25 HYDROXY (VIT D DEFICIENCY, FRACTURES): Vit D, 25-Hydroxy: 37 ng/mL (ref 30–100)

## 2020-06-06 LAB — TSH: TSH: 1.87 mIU/L (ref 0.40–4.50)

## 2020-06-06 NOTE — Progress Notes (Signed)
Dr Lavone Neri interpretation of your lab work:  Good news! Your labs are all NORMAL. Keep up the good work and we can recheck in a few years.    If you have any additional questions, please give [place] a call or send [place] a message through [place].  Take care, Dr Jimmey Ralph

## 2020-06-19 ENCOUNTER — Other Ambulatory Visit: Payer: Self-pay | Admitting: Nurse Practitioner

## 2020-06-19 ENCOUNTER — Ambulatory Visit (INDEPENDENT_AMBULATORY_CARE_PROVIDER_SITE_OTHER): Payer: 59 | Admitting: Nurse Practitioner

## 2020-06-19 ENCOUNTER — Encounter: Payer: Self-pay | Admitting: Nurse Practitioner

## 2020-06-19 VITALS — BP 102/68 | HR 71 | Ht 74.0 in | Wt 187.2 lb

## 2020-06-19 DIAGNOSIS — K649 Unspecified hemorrhoids: Secondary | ICD-10-CM | POA: Diagnosis not present

## 2020-06-19 DIAGNOSIS — K625 Hemorrhage of anus and rectum: Secondary | ICD-10-CM | POA: Diagnosis not present

## 2020-06-19 DIAGNOSIS — K5900 Constipation, unspecified: Secondary | ICD-10-CM | POA: Diagnosis not present

## 2020-06-19 MED ORDER — HYDROCORTISONE ACETATE 25 MG RE SUPP: 25.0000 mg | Freq: Every day | RECTAL | 0 refills | Status: DC

## 2020-06-19 MED FILL — HYDROCORTISONE AC 25 MG SUP: 25 | 5 days supply | Qty: 5 | Fill #0

## 2020-06-19 NOTE — Progress Notes (Signed)
06/19/2020 Dennis Grant [id number] 10-07-1985   CHIEF COMPLAINT: Rectal bleeding   HISTORY OF PRESENT ILLNESS:  Dennis Grant is a 34 year old male with no significant past medical history. No past surgical history. He presents to our office today as referred by Jarold Motto PA-C for further evaluation for rectal bleeding and hemorrhoids. Three month ago, he had constipation and he strained to pass a solid BM. He saw bright red blood on the tip of his stool and drops of blood in the toilet water which occurred daily for about one week. He increased his water and dietary fiber intake and his rectal bleeding resolved. About six weeks later, he passed a bowel movement, strained a little but did not feel constipated which resulted in hemorrhoidal swelling with a small amount of red blood on the toilet tissue x 1.  He looked in the mirror and he saw a small black area on his hemorrhoid. His hemorrhoidal swelling resolved within one week after he used an ice pack. No further hemorrhoidal swelling or rectal bleeding since that time. No abdominal pain. No fever, sweats or chills. No weight loss. No family history of colon cancer.    CBC Latest Ref Rng & Units 06/05/2020 05/30/2019  WBC 3.8 - 10.8 Thousand/uL 3.7(L) 3.8(L)  Hemoglobin 13.2 - 17.1 g/dL 67.3 41.9  Hematocrit 38 - 50 % 42.8 41.4  Platelets 140 - 400 Thousand/uL 162 175.0    CMP Latest Ref Rng & Units 06/05/2020 05/30/2019 05/25/2018  Glucose 65 - 99 mg/dL 83 87 89  BUN 7 - 25 mg/dL 9 10 -  Creatinine 3.79 - 1.35 mg/dL 0.24 0.97 -  Sodium 353 - 146 mmol/L 139 139 -  Potassium 3.5 - 5.3 mmol/L 4.1 4.0 -  Chloride 98 - 110 mmol/L 102 102 -  CO2 20 - 32 mmol/L 30 31 -  Calcium 8.6 - 10.3 mg/dL 9.2 9.6 -  Total Protein 6.1 - 8.1 g/dL 6.7 6.6 -  Total Bilirubin 0.2 - 1.2 mg/dL 0.9 0.9 -  Alkaline Phos 39 - 117 U/L - 93 -  AST 10 - 40 U/L 15 15 -  ALT 9 - 46 U/L 15 20 -    Family History: Mother 61's with HTN, back pain. Father  67's healthy. Two brothers are healthy. Maternal aunt ? cancer.    Social History: Nonsmoker. He drinks one beer monthly or less. No history of drug use.   Allergies  Allergen Reactions  . Penicillins     Episode of flushing and sweating      Outpatient Encounter Medications as of 06/19/2020  Medication Sig  . cetirizine (ZYRTEC) 10 MG tablet Take 10 mg by mouth daily. (Patient not taking: Reported on 06/05/2020)  . Multiple Vitamin (MULTIVITAMIN) tablet Take 1 tablet by mouth daily. (Patient not taking: Reported on 06/05/2020)  . Probiotic Product (CULTURELLE PROBIOTICS PO) Take by mouth. (Patient not taking: Reported on 06/05/2020)   No facility-administered encounter medications on file as of 06/19/2020.     REVIEW OF SYSTEMS:  Gen: Denies fever, sweats or chills. No weight loss.  CV: Denies chest pain, palpitations or edema. Resp: Denies cough, shortness of breath of hemoptysis.  GI: See HPI.  GU : Denies urinary burning, blood in urine, increased urinary frequency or incontinence. MS: Denies joint pain, muscles aches or weakness. Derm: Denies rash, itchiness, skin lesions or unhealing ulcers. Psych: Denies depression or anxiety.  Heme: Denies bruising, bleeding. Neuro:  Denies headaches, dizziness  or paresthesias. Endo:  Denies any problems with DM, thyroid or adrenal function.    PHYSICAL EXAM: BP 102/68   Pulse 71   Ht 6\' 2"  (1.88 m)   Wt 187 lb 4 oz (84.9 kg)   BMI 24.04 kg/m   General: Well developed 34 year old male in no acute distress. Head: Normocephalic and atraumatic. Eyes:  Sclerae non-icteric, conjunctive pink. Ears: Normal auditory acuity. Mouth: Dentition intact. No ulcers or lesions.  Neck: Supple, no lymphadenopathy or thyromegaly.  Lungs: Clear bilaterally to auscultation without wheezes, crackles or rhonchi. Heart: Regular rate and rhythm. No murmur, rub or gallop appreciated.  Abdomen: Soft, nontender, non distended. No masses. No  hepatosplenomegaly. Normoactive bowel sounds x 4 quadrants.  Rectal: Small left anal tag/small external hemorrhoid, moderately inflamed anal hemorrhoids without thrombosis or active bleeding. No mass. No stool in the rectal vault. 31 CMA present.  Musculoskeletal: Symmetrical with no gross deformities. Skin: Warm and dry. No rash or lesions on visible extremities. Extremities: No edema. Neurological: Alert oriented x 4, no focal deficits.  Psychological:  Alert and cooperative. Normal mood and affect.  ASSESSMENT AND PLAN:  27. 34 year old male with rectal bleeding. Anal hemorrhoids without thrombosis.  -Anusol HC 25mg  suppository 1 PR QHS x 5 nights, then insert a small amount of Desitin in the anal opening x 5 nights. -Desitin PRN -Benefiber 1 tsp QD -Miralax Q HS PRN -Recommended scheduling a future colonoscopy to rule out any rectal ulcer/polyp or malignant process above the site of hemorrhoids. He elects to follow up with Dr. 31 in 6 weeks for hemorrhoid follow up and to schedule a colonoscopy -Patient will call our office if his symptoms recur  2. Constipation -See plan in # 1    CC:  , MD

## 2020-06-19 NOTE — Patient Instructions (Addendum)
If you are age 36 or older, your body mass index should be between 23-30. Your Body mass index is 24.04 kg/m. If this is out of the aforementioned range listed, please consider follow up with your Primary Care Provider.  If you are age 13 or younger, your body mass index should be between 19-25. Your Body mass index is 24.04 kg/m. If this is out of the aformentioned range listed, please consider follow up with your Primary Care Provider.   START Benefiber 1 tablespoon daily.  START Miralax 17 grams (1 capful) in 8 ounces of water/juice every night as needed for constipation   START Hydrocortisone suppositories 1 every night for 5 nights  Use Desitin as directed for 5 nights, then as needed.  You have been scheduled to follow up with Dr. Adela Lank August 15, 2020 at 10:30 am

## 2020-06-20 NOTE — Progress Notes (Signed)
Agree with assessment and plan as outlined.  

## 2020-06-25 IMAGING — DX DG KNEE AP/LAT W/ SUNRISE*R*
3 series · 3 of 3 positions shown · non-contrast
Comparison: None.

CLINICAL DATA: Right knee pain for 2 months in the region of the
distal patella. No known injury.

EXAM:
RIGHT KNEE 3 VIEWS

[knee standing ap]
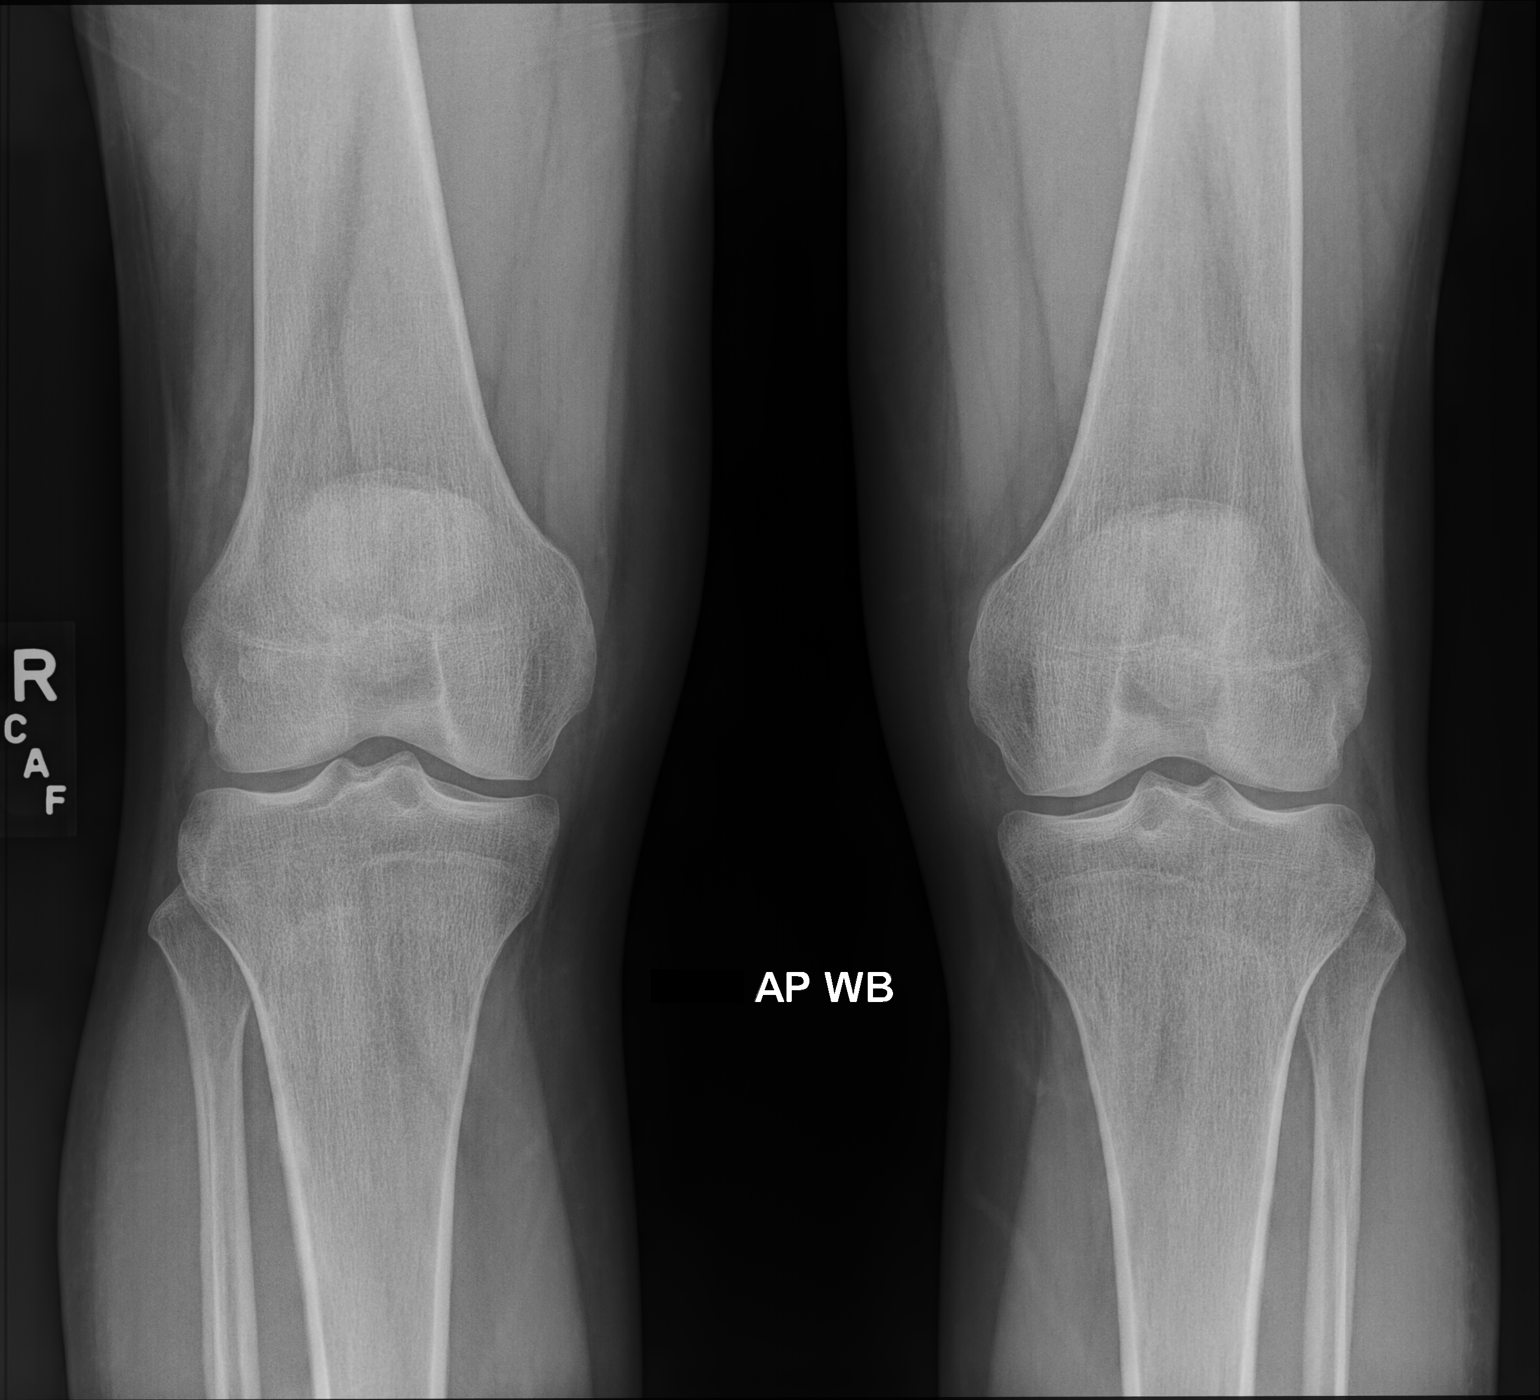

[knee standing lat]
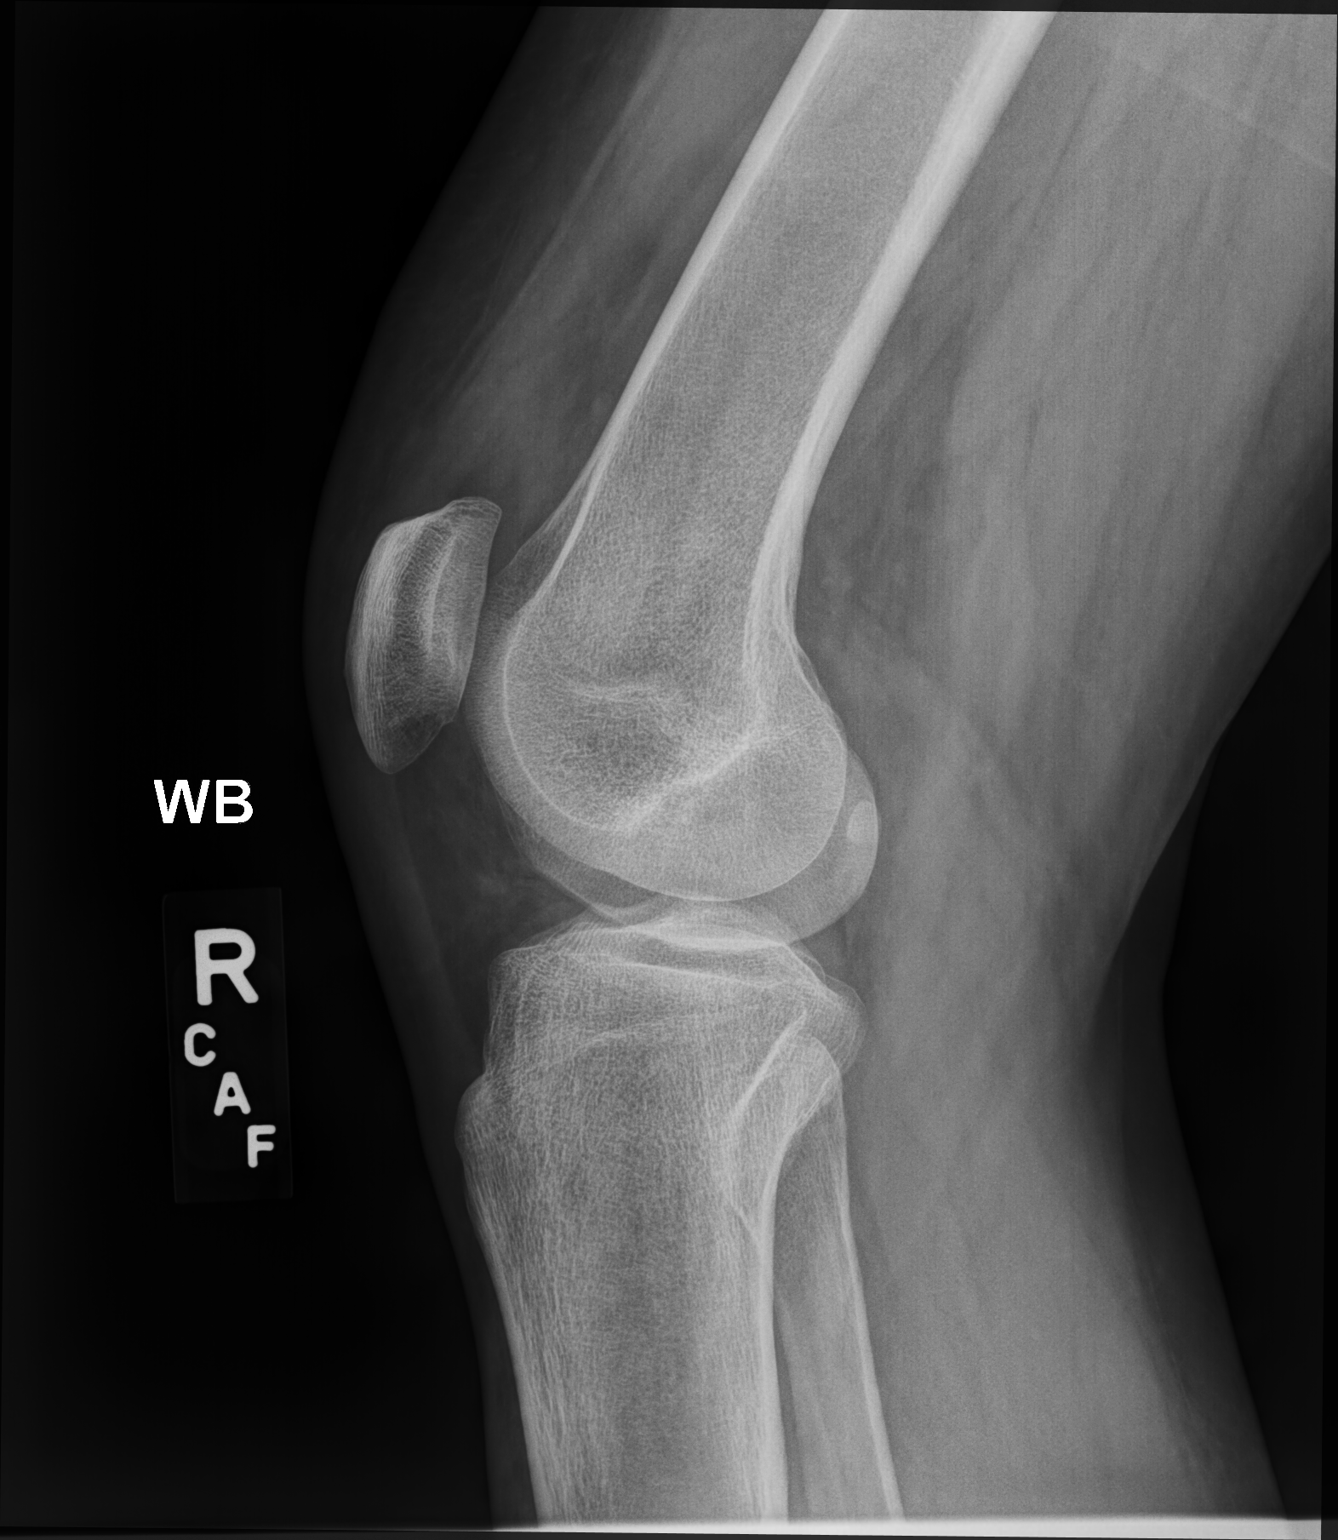

[sunrise]
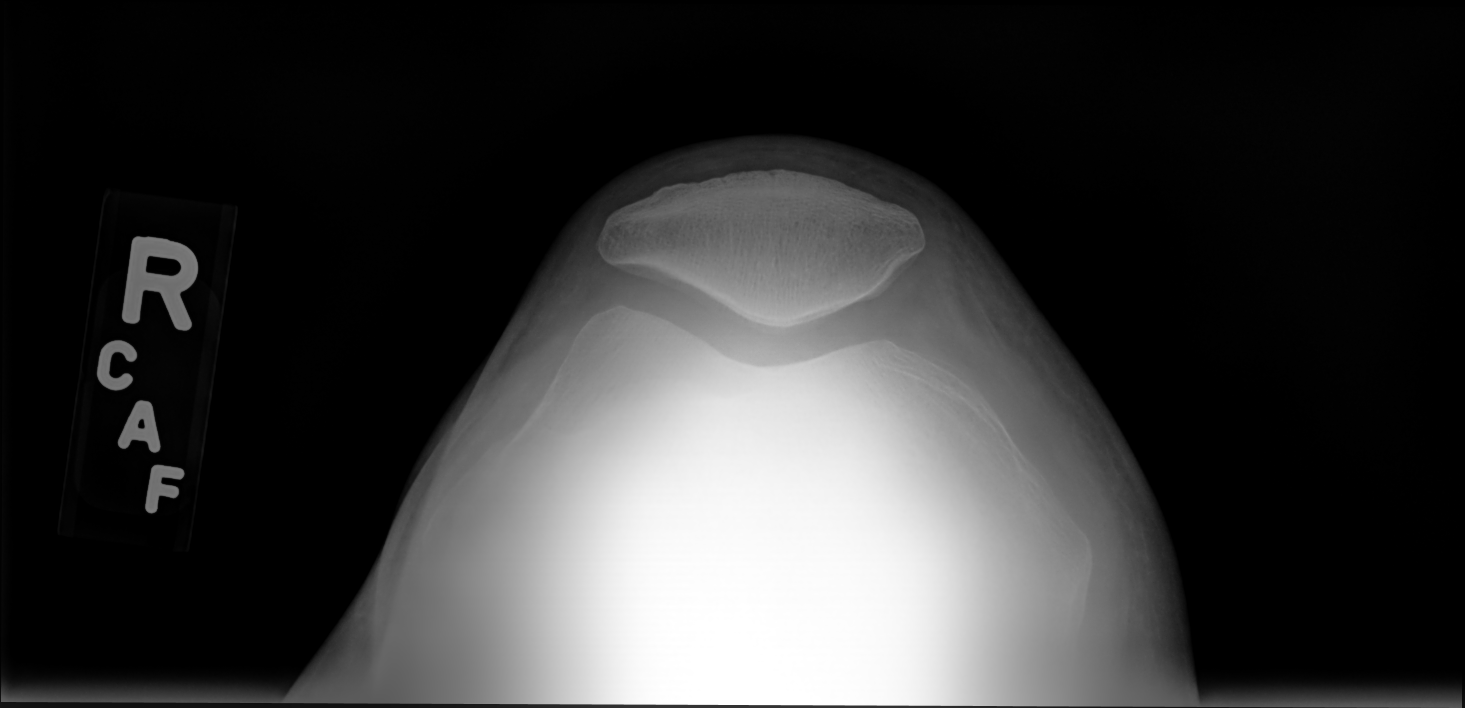

[3 of 3 positions shown; findings below may reference images not displayed]

FINDINGS: No right knee fracture or dislocation is identified. There is likely
a small right knee joint effusion. Right knee joint space widths are
preserved. Limited assessment of the left knee in the AP projection
only is unremarkable.
IMPRESSION: 1. Likely small right knee joint effusion.
2. No acute osseous abnormality identified.

## 2020-08-15 ENCOUNTER — Ambulatory Visit: Payer: 59 | Admitting: Gastroenterology

## 2020-09-03 DIAGNOSIS — H5213 Myopia, bilateral: Secondary | ICD-10-CM | POA: Diagnosis not present

## 2021-05-06 ENCOUNTER — Encounter: Payer: Self-pay | Admitting: Internal Medicine

## 2021-05-06 ENCOUNTER — Other Ambulatory Visit: Payer: Self-pay

## 2021-05-06 ENCOUNTER — Ambulatory Visit (INDEPENDENT_AMBULATORY_CARE_PROVIDER_SITE_OTHER): Payer: BC Managed Care – PPO | Admitting: Internal Medicine

## 2021-05-06 VITALS — BP 110/74 | HR 89 | Temp 98.3°F | Wt 180.4 lb

## 2021-05-06 DIAGNOSIS — R195 Other fecal abnormalities: Secondary | ICD-10-CM

## 2021-05-06 NOTE — Progress Notes (Signed)
Established Patient Office Visit     This visit occurred during the SARS-CoV-2 public health emergency.  Safety protocols were in place, including screening questions prior to the visit, additional usage of staff PPE, and extensive cleaning of exam room while observing appropriate contact time as indicated for disinfecting solutions.    CC/Reason for Visit: Dark stool  HPI: Dennis Grant is a 35 y.o. male who is coming in today for the above mentioned reasons. Past Medical History is significant for: Internal and external hemorrhoids.  He was prescribed Anusol last year for this.  His issues healed without need for further intervention.  He has never had a colonoscopy.  Last Sunday he had a meal that really upset his stomach, caused a lot of distention and bloating.  He has been a little nauseous.  That day he noticed some dark green stool, it has progressively improved since then.  No frank blood.  No abdominal pain, no vomiting.   Past Medical/Surgical History: No past medical history on file.  No past surgical history on file.  Social History:  reports that he has never smoked. He has never used smokeless tobacco. He reports that he does not drink alcohol and does not use drugs.  Allergies: Allergies  Allergen Reactions   Penicillins     Episode of flushing and sweating    Family History:  Family History  Problem Relation Age of Onset   Coronary artery disease Other        no premature cad or sudden death   Hypertension Mother    Cancer Maternal Aunt        Leukemia   Colon cancer Neg Hx    Stomach cancer Neg Hx    Pancreatic cancer Neg Hx      Current Outpatient Medications:    cetirizine (ZYRTEC) 10 MG tablet, Take 10 mg by mouth daily. As needed, Disp: , Rfl:   Review of Systems:  Constitutional: Denies fever, chills, diaphoresis, appetite change and fatigue.  HEENT: Denies photophobia, eye pain, redness, hearing loss, ear pain, congestion, sore throat,  rhinorrhea, sneezing, mouth sores, trouble swallowing, neck pain, neck stiffness and tinnitus.   Respiratory: Denies SOB, DOE, cough, chest tightness,  and wheezing.   Cardiovascular: Denies chest pain, palpitations and leg swelling.  Gastrointestinal: Denies nausea,  abdominal pain,  constipation, blood in stool. Genitourinary: Denies dysuria, urgency, frequency, hematuria, flank pain and difficulty urinating.  Endocrine: Denies: hot or cold intolerance, sweats, changes in hair or nails, polyuria, polydipsia. Musculoskeletal: Denies myalgias, back pain, joint swelling, arthralgias and gait problem.  Skin: Denies pallor, rash and wound.  Neurological: Denies dizziness, seizures, syncope, weakness, light-headedness, numbness and headaches.  Hematological: Denies adenopathy. Easy bruising, personal or family bleeding history  Psychiatric/Behavioral: Denies suicidal ideation, mood changes, confusion, nervousness, sleep disturbance and agitation    Physical Exam: Vitals:   05/06/21 0928  BP: 110/74  Pulse: 89  Temp: 98.3 F (36.8 C)  TempSrc: Oral  SpO2: 98%  Weight: 180 lb 6.4 oz (81.8 kg)    Body mass index is 23.16 kg/m.   Constitutional: NAD, calm, comfortable Eyes: PERRL, lids and conjunctivae normal, wears corrective lenses ENMT: Mucous membranes are moist.  Neurologic: Grossly intact and nonfocal Psychiatric: Normal judgment and insight. Alert and oriented x 3. Normal mood.    Impression and Plan:  Dark stools -He does not describe stool as black, mostly dark green happening immediately after a meal that caused stomach upset with distention and  bloating. -I think bleeding is less likely, although he does have a history of a hemorrhoid. -I wonder about food poisoning, mild gastroenteritis, may be even COVID. -In any case he is improving, no further episodes of dark stools. -Reviewing prior GI note it was thought that he needed a colonoscopy and have advised him to call  them to schedule.  Time spent: 21 minutes reviewing chart, interviewing and examining patient and formulating plan of care.   Chaya Jan, MD [facility]

## 2021-06-10 ENCOUNTER — Ambulatory Visit (INDEPENDENT_AMBULATORY_CARE_PROVIDER_SITE_OTHER): Payer: BC Managed Care – PPO | Admitting: Family Medicine

## 2021-06-10 ENCOUNTER — Other Ambulatory Visit: Payer: Self-pay

## 2021-06-10 VITALS — BP 124/64 | HR 80 | Temp 97.4°F | Ht 74.0 in | Wt 182.6 lb

## 2021-06-10 DIAGNOSIS — Z0001 Encounter for general adult medical examination with abnormal findings: Secondary | ICD-10-CM | POA: Diagnosis not present

## 2021-06-10 DIAGNOSIS — E785 Hyperlipidemia, unspecified: Secondary | ICD-10-CM

## 2021-06-10 DIAGNOSIS — Z23 Encounter for immunization: Secondary | ICD-10-CM | POA: Diagnosis not present

## 2021-06-10 NOTE — Assessment & Plan Note (Signed)
Continue lifestyle modifications.  We can recheck labs next year.

## 2021-06-10 NOTE — Patient Instructions (Signed)
It was very nice to see you today!  We get your flu shot today.  Please continue working on diet and exercise.  I will see you back in year.  Please come back to see me sooner if needed.  Take care, Dr Jimmey Ralph  PLEASE NOTE:  If you had any lab tests please let us know if you have not heard back within a few days. You may see your results on mychart before we have a chance to review them but we will give you a call once they are reviewed by [place]. If we ordered any referrals today, please let us know if you have not heard from their office within the next week.   Please try these tips to maintain a healthy lifestyle:  Eat at least 3 REAL meals and 1-2 snacks per day.  Aim for no more than 5 hours between eating.  If you eat breakfast, please do so within one hour of getting up.   Each meal should contain half fruits/vegetables, one quarter protein, and one quarter carbs (no bigger than a computer mouse)  Cut down on sweet beverages. This includes juice, soda, and sweet tea.   Drink at least 1 glass of water with each meal and aim for at least 8 glasses per day  Exercise at least 150 minutes every week.    Preventive Care 56-76 Years Old, Male Preventive care refers to lifestyle choices and visits with your health care provider that can promote health and wellness. This includes: A yearly physical exam. This is also called an annual wellness visit. Regular dental and eye exams. Immunizations. Screening for certain conditions. Healthy lifestyle choices, such as: Eating a healthy diet. Getting regular exercise. Not using drugs or products that contain nicotine and tobacco. Limiting alcohol use. What can I expect for my preventive care visit? Physical exam Your health care provider may check your: Height and weight. These may be used to calculate your BMI (body mass index). BMI is a measurement that tells if you are at a healthy weight. Heart rate and blood pressure. Body  temperature. Skin for abnormal spots. Counseling Your health care provider may ask you questions about your: Past medical problems. Family's medical history. Alcohol, tobacco, and drug use. Emotional well-being. Home life and relationship well-being. Sexual activity. Diet, exercise, and sleep habits. Work and work Astronomer. Access to firearms. What immunizations do I need? Vaccines are usually given at various ages, according to a schedule. Your health care provider will recommend vaccines for you based on your age, medical history, and lifestyle or other factors, such as travel or where you work. What tests do I need? Blood tests Lipid and cholesterol levels. These may be checked every 5 years starting at age 53. Hepatitis C test. Hepatitis B test. Screening  Diabetes screening. This is done by checking your blood sugar (glucose) after you have not eaten for a while (fasting). Genital exam to check for testicular cancer or hernias. STD (sexually transmitted disease) testing, if you are at risk. Talk with your health care provider about your test results, treatment options, and if necessary, the need for more tests. Follow these instructions at home: Eating and drinking  Eat a healthy diet that includes fresh fruits and vegetables, whole grains, lean protein, and low-fat dairy products. Drink enough fluid to keep your urine pale yellow. Take vitamin and mineral supplements as recommended by your health care provider. Do not drink alcohol if your health care provider tells you not to  drink. If you drink alcohol: Limit how much you have to 0-2 drinks a day. Be aware of how much alcohol is in your drink. In the [place] one drink equals one 12 oz bottle of beer (355 mL), one 5 oz glass of wine (148 mL), or one 1 oz glass of hard liquor (44 mL). Lifestyle Take daily care of your teeth and gums. Brush your teeth every morning and night with fluoride toothpaste. Floss one time each  day. Stay active. Exercise for at least 30 minutes 5 or more days each week. Do not use any products that contain nicotine or tobacco, such as cigarettes, e-cigarettes, and chewing tobacco. If you need help quitting, ask your health care provider. Do not use drugs. If you are sexually active, practice safe sex. Use a condom or other form of protection to prevent STIs (sexually transmitted infections). Find healthy ways to cope with stress, such as: Meditation, yoga, or listening to music. Journaling. Talking to a trusted person. Spending time with friends and family. Safety Always wear your seat belt while driving or riding in a vehicle. Do not drive: If you have been drinking alcohol. Do not ride with someone who has been drinking. When you are tired or distracted. While texting. Wear a helmet and other protective equipment during sports activities. If you have firearms in your house, make sure you follow all gun safety procedures. Seek help if you have been physically or sexually abused. What's next? Go to your health care provider once a year for an annual wellness visit. Ask your health care provider how often you should have your eyes and teeth checked. Stay up to date on all vaccines. This information is not intended to replace advice given to you by your health care provider. Make sure you discuss any questions you have with your health care provider. Document Revised: 11/01/2020 Document Reviewed: 08/18/2018 Elsevier Patient Education  2022 [employer].

## 2021-06-10 NOTE — Progress Notes (Signed)
Chief Complaint:  Dennis Grant is a 35 y.o. male who presents today for his annual comprehensive physical exam.    Assessment/Plan:  Chronic Problems Addressed Today: Dyslipidemia Continue lifestyle modifications.  We can recheck labs next year.  Preventative Healthcare: Flu vaccine given today.   Patient Counseling(The following topics were reviewed and/or handout was given):  -Nutrition: Stressed importance of moderation in sodium/caffeine intake, saturated fat and cholesterol, caloric balance, sufficient intake of fresh fruits, vegetables, and fiber.  -Stressed the importance of regular exercise.   -Substance Abuse: Discussed cessation/primary prevention of tobacco, alcohol, or other drug use; driving or other dangerous activities under the influence; availability of treatment for abuse.   -Injury prevention: Discussed safety belts, safety helmets, smoke detector, smoking near bedding or upholstery.   -Sexuality: Discussed sexually transmitted diseases, partner selection, use of condoms, avoidance of unintended pregnancy and contraceptive alternatives.   -Dental health: Discussed importance of regular tooth brushing, flossing, and dental visits.  -Health maintenance and immunizations reviewed. Please refer to Health maintenance section.  Return to care in 1 year for next preventative visit.     Subjective:  HPI:  He has no acute complaints today.   He has had issue with dark stools. However, this issue has progressively improved since then. He denies bleeding, or vomiting at this time. He has no family Pmhx of colon cancer.    Lifestyle Diet: Balanced Exercise: Limited  Depression screen [facility] 2/9 05/30/2019  Decreased Interest 0  Down, Depressed, Hopeless 0  PHQ - 2 Score 0  Altered sleeping 1  Tired, decreased energy 1  Change in appetite 0  Feeling bad or failure about yourself  0  Trouble concentrating 0  Moving slowly or fidgety/restless 0  Suicidal thoughts 0   PHQ-9 Score 2  Difficult doing work/chores Not difficult at all    Health Maintenance Due  Topic Date Due   Hepatitis C Screening  Never done     ROS: Per HPI, otherwise a complete review of systems was negative.   PMH:  The following were reviewed and entered/updated in epic: No past medical history on file. Patient Active Problem List   Diagnosis Date Noted   B12 deficiency 06/05/2020   Vitamin D deficiency 06/05/2020   Dyslipidemia 06/05/2020   No past surgical history on file.  Family History  Problem Relation Age of Onset   Coronary artery disease Other        no premature cad or sudden death   Hypertension Mother    Cancer Maternal Aunt        Leukemia   Colon cancer Neg Hx    Stomach cancer Neg Hx    Pancreatic cancer Neg Hx     Medications- reviewed and updated Current Outpatient Medications  Medication Sig Dispense Refill   cetirizine (ZYRTEC) 10 MG tablet Take 10 mg by mouth daily. As needed     No current facility-administered medications for this visit.    Allergies-reviewed and updated Allergies  Allergen Reactions   Penicillins     Episode of flushing and sweating    Social History   Socioeconomic History   Marital status: Single    Spouse name: Not on file   Number of children: Not on file   Years of education: Not on file   Highest education level: Not on file  Occupational History   Occupation: Part Time  Tobacco Use   Smoking status: Never   Smokeless tobacco: Never  Vaping Use  Vaping Use: Never used  Substance and Sexual Activity   Alcohol use: No   Drug use: Never   Sexual activity: Not on file  Other Topics Concern   Not on file  Social History Narrative   Not on file   Social Determinants of Health   Financial Resource Strain: Not on file  Food Insecurity: Not on file  Transportation Needs: Not on file  Physical Activity: Not on file  Stress: Not on file  Social Connections: Not on file        Objective:   Physical Exam: BP 124/64   Pulse 80   Temp (!) 97.4 F (36.3 C)   Ht 6\' 2"  (1.88 m)   Wt 182 lb 9.6 oz (82.8 kg)   SpO2 100%   BMI 23.44 kg/m   Body mass index is 23.44 kg/m. Wt Readings from Last 3 Encounters:  06/10/21 182 lb 9.6 oz (82.8 kg)  05/06/21 180 lb 6.4 oz (81.8 kg)  06/19/20 187 lb 4 oz (84.9 kg)   Gen: NAD, resting comfortably HEENT: TMs normal bilaterally. OP clear. No thyromegaly noted.  CV: RRR with no murmurs appreciated Pulm: NWOB, CTAB with no crackles, wheezes, or rhonchi GI: Normal bowel sounds present. Soft, Nontender, Nondistended. MSK: no edema, cyanosis, or clubbing noted Skin: warm, dry Neuro: CN2-12 grossly intact. Strength 5/5 in upper and lower extremities. Reflexes symmetric and intact bilaterally.  Psych: Normal affect and thought content      I,Savera Zaman,acting as a scribe for 06/21/20, MD.,have documented all relevant documentation on the behalf of Dennis Doe, MD,as directed by  Dennis Doe, MD while in the presence of Dennis Doe, MD.   I, Dennis Doe, MD, have reviewed all documentation for this visit. The documentation on 06/10/21 for the exam, diagnosis, procedures, and orders are all accurate and complete.  08/10/21. Katina Degree, MD 06/10/2021 9:01 AM

## 2021-08-20 ENCOUNTER — Telehealth: Payer: Self-pay

## 2021-08-20 NOTE — Telephone Encounter (Signed)
Pt called stating that he tested positive for covid on 12/9 and symptoms started 12/8. Pt wants to discuss what he should be doing after having covid. Please Advise.

## 2021-08-21 ENCOUNTER — Telehealth: Payer: BC Managed Care – PPO | Admitting: Physician Assistant

## 2021-08-21 DIAGNOSIS — J029 Acute pharyngitis, unspecified: Secondary | ICD-10-CM

## 2021-08-21 NOTE — Telephone Encounter (Signed)
Please see note and advise  

## 2021-08-21 NOTE — Telephone Encounter (Signed)
If symptoms are improving he does not need to do anything else at this point. He should isolate per CDC guidelines which is total isolation for days 0-5, and then ok to be around others with a mask from days 5-10.

## 2021-08-21 NOTE — Telephone Encounter (Signed)
Pt verified DOB ; also advised If symptoms are improving he does not need to do anything else at this point. He should isolate per CDC guidelines which is total isolation for days 0-5, and then ok to be around others with a mask from days 5-10. Pt verbalized understanding.

## 2021-08-21 NOTE — Progress Notes (Signed)
°  E-Visit for Sore Throat  We are sorry that you are not feeling well.  Here is how we plan to help!  Your symptoms indicate a likely viral infection (Pharyngitis).   Pharyngitis is inflammation in the back of the throat which can cause a sore throat, scratchiness and sometimes difficulty swallowing.   Pharyngitis is typically caused by a respiratory virus and will just run its course. In your case this would be secondary to COVID infection.  Please keep in mind that your symptoms could last up to 10 days.  For throat pain, we recommend over the counter oral pain relief medications such as acetaminophen or aspirin, or anti-inflammatory medications such as ibuprofen or naproxen sodium.  Topical treatments such as oral throat lozenges or sprays may be used as needed.  Avoid close contact with loved ones, especially the very young and elderly.  Remember to wash your hands thoroughly throughout the day as this is the number one way to prevent the spread of infection and wipe down door knobs and counters with disinfectant.  After careful review of your answers, I would not recommend an antibiotic for your condition.  Antibiotics should not be used to treat conditions that we suspect are caused by viruses like the virus that causes the common cold or flu. However, some people can have Strep with atypical symptoms. You may need formal testing in clinic or office to confirm if your symptoms continue or worsen.  Providers prescribe antibiotics to treat infections caused by bacteria. Antibiotics are very powerful in treating bacterial infections when they are used properly.  To maintain their effectiveness, they should be used only when necessary.  Overuse of antibiotics has resulted in the development of super bugs that are resistant to treatment!    Home Care: Only take medications as instructed by your medical team. Do not drink alcohol while taking these medications. A steam or ultrasonic humidifier can help  congestion.  You can place a towel over your head and breathe in the steam from hot water coming from a faucet. Avoid close contacts especially the very young and the elderly. Cover your mouth when you cough or sneeze. Always remember to wash your hands.  Get Help Right Away If: You develop worsening fever or throat pain. You develop a severe head ache or visual changes. Your symptoms persist after you have completed your treatment plan.  Make sure you Understand these instructions. Will watch your condition. Will get help right away if you are not doing well or get worse.   Thank you for choosing an e-visit.  Your e-visit answers were reviewed by a board certified advanced clinical practitioner to complete your personal care plan. Depending upon the condition, your plan could have included both over the counter or prescription medications.  Please review your pharmacy choice. Make sure the pharmacy is open so you can pick up prescription now. If there is a problem, you may contact your provider through [employer] and have the prescription routed to another pharmacy.  Your safety is important to [place]. If you have drug allergies check your prescription carefully.   For the next 24 hours you can use MyChart to ask questions about today's visit, request a non-urgent call back, or ask for a work or school excuse. You will get an email in the next two days asking about your experience. I hope that your e-visit has been valuable and will speed your recovery.

## 2021-08-21 NOTE — Progress Notes (Signed)
I have spent 5 minutes in review of e-visit questionnaire, review and updating patient chart, medical decision making and response to patient.   Josefita Weissmann Cody Halah Whiteside, PA-C    

## 2022-05-22 ENCOUNTER — Other Ambulatory Visit (HOSPITAL_BASED_OUTPATIENT_CLINIC_OR_DEPARTMENT_OTHER): Payer: Self-pay

## 2022-05-22 MED ORDER — PENICILLIN V POTASSIUM 500 MG PO TABS
500.0000 mg | ORAL_TABLET | Freq: Four times a day (QID) | ORAL | 0 refills | Status: DC
  Filled 2022-05-22: qty 28, 7d supply, fill #0

## 2022-05-22 MED ORDER — HYDROCODONE-ACETAMINOPHEN 5-325 MG PO TABS
1.0000 | ORAL_TABLET | ORAL | 0 refills | Status: DC
  Filled 2022-05-22: qty 14, 3d supply, fill #0

## 2022-06-01 ENCOUNTER — Encounter: Payer: Self-pay | Admitting: *Deleted

## 2022-06-12 ENCOUNTER — Encounter: Payer: BC Managed Care – PPO | Admitting: Family Medicine

## 2022-06-16 ENCOUNTER — Ambulatory Visit (INDEPENDENT_AMBULATORY_CARE_PROVIDER_SITE_OTHER): Payer: BLUE CROSS/BLUE SHIELD | Admitting: Family Medicine

## 2022-06-16 ENCOUNTER — Encounter: Payer: Self-pay | Admitting: Family Medicine

## 2022-06-16 VITALS — BP 111/73 | HR 79 | Temp 97.7°F | Ht 74.0 in | Wt 182.4 lb

## 2022-06-16 DIAGNOSIS — R739 Hyperglycemia, unspecified: Secondary | ICD-10-CM | POA: Diagnosis not present

## 2022-06-16 DIAGNOSIS — Z0001 Encounter for general adult medical examination with abnormal findings: Secondary | ICD-10-CM

## 2022-06-16 DIAGNOSIS — Z1159 Encounter for screening for other viral diseases: Secondary | ICD-10-CM | POA: Diagnosis not present

## 2022-06-16 DIAGNOSIS — M25569 Pain in unspecified knee: Secondary | ICD-10-CM | POA: Insufficient documentation

## 2022-06-16 DIAGNOSIS — Z23 Encounter for immunization: Secondary | ICD-10-CM

## 2022-06-16 DIAGNOSIS — E538 Deficiency of other specified B group vitamins: Secondary | ICD-10-CM

## 2022-06-16 DIAGNOSIS — Z8052 Family history of malignant neoplasm of bladder: Secondary | ICD-10-CM | POA: Insufficient documentation

## 2022-06-16 DIAGNOSIS — E785 Hyperlipidemia, unspecified: Secondary | ICD-10-CM

## 2022-06-16 DIAGNOSIS — E559 Vitamin D deficiency, unspecified: Secondary | ICD-10-CM | POA: Diagnosis not present

## 2022-06-16 DIAGNOSIS — R002 Palpitations: Secondary | ICD-10-CM

## 2022-06-16 LAB — COMPREHENSIVE METABOLIC PANEL
ALT: 26 U/L (ref 0–53)
AST: 16 U/L (ref 0–37)
Albumin: 4.2 g/dL (ref 3.5–5.2)
Alkaline Phosphatase: 101 U/L (ref 39–117)
BUN: 10 mg/dL (ref 6–23)
CO2: 31 mEq/L (ref 19–32)
Calcium: 9.3 mg/dL (ref 8.4–10.5)
Chloride: 102 mEq/L (ref 96–112)
Creatinine, Ser: 1.06 mg/dL (ref 0.40–1.50)
GFR: 88.96 mL/min (ref >60.00)
Glucose, Bld: 95 mg/dL (ref 70–99)
Potassium: 4 mEq/L (ref 3.5–5.1)
Sodium: 139 mEq/L (ref 135–145)
Total Bilirubin: 0.9 mg/dL (ref 0.2–1.2)
Total Protein: 7 g/dL (ref 6.0–8.3)

## 2022-06-16 LAB — CBC
HCT: 41.4 % (ref 39.0–52.0)
Hemoglobin: 14.5 g/dL (ref 13.0–17.0)
MCHC: 35.1 g/dL (ref 30.0–36.0)
MCV: 77.8 fl — ABNORMAL LOW (ref 78.0–100.0)
Platelets: 183 10*3/uL (ref 150.0–400.0)
RBC: 5.32 Mil/uL (ref 4.22–5.81)
RDW: 14.8 % (ref 11.5–15.5)
WBC: 3.4 10*3/uL — ABNORMAL LOW (ref 4.0–10.5)

## 2022-06-16 LAB — VITAMIN D 25 HYDROXY (VIT D DEFICIENCY, FRACTURES): VITD: 24.26 ng/mL — ABNORMAL LOW (ref 30.00–100.00)

## 2022-06-16 LAB — LIPID PANEL
Cholesterol: 170 mg/dL (ref 0–200)
HDL: 39.7 mg/dL (ref >39.00)
LDL Cholesterol: 107 mg/dL — ABNORMAL HIGH (ref 0–99)
NonHDL: 129.93
Total CHOL/HDL Ratio: 4
Triglycerides: 113 mg/dL (ref 0.0–149.0)
VLDL: 22.6 mg/dL (ref 0.0–40.0)

## 2022-06-16 LAB — HEMOGLOBIN A1C: Hgb A1c MFr Bld: 4.5 % — ABNORMAL LOW (ref 4.6–6.5)

## 2022-06-16 LAB — TSH: TSH: 1.88 u[IU]/mL (ref 0.35–5.50)

## 2022-06-16 LAB — VITAMIN B12: Vitamin B-12: 206 pg/mL — ABNORMAL LOW (ref 211–911)

## 2022-06-16 NOTE — Assessment & Plan Note (Signed)
No red flags.  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  He was evaluated cardiology about 10 years ago.  We will check labs today.  Given that he has not had any recurrence for last couple of months do not feel like cardiac monitoring would be of much benefit at this point.  We discussed reasons to return to care and seek emergent care.

## 2022-06-16 NOTE — Assessment & Plan Note (Signed)
Check vitamin D. 

## 2022-06-16 NOTE — Progress Notes (Signed)
Chief Complaint:  Dennis Grant is a 36 y.o. male who presents today for his annual comprehensive physical exam.    Assessment/Plan:  Chronic Problems Addressed Today: Knee pain No red flags.  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  Given mild nature of symptoms would not pursue any further work-up at this point.  We discussed home exercises.  He will let me know if symptoms worsen.  Family history of bladder cancer Brother was recently diagnosed with bladder cancer.  We discussed yearly UA however he deferred for now.  He will let me know if he develops any symptoms.  Dyslipidemia Check lipids.  Discussed lifestyle modifications.  Vitamin D deficiency Check vitamin D.  B12 deficiency Check B12.  Palpitation No red flags.  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  He was evaluated cardiology about 10 years ago.  We will check labs today.  Given that he has not had any recurrence for last couple of months do not feel like cardiac monitoring would be of much benefit at this point.  We discussed reasons to return to care and seek emergent care.  Preventative Healthcare: Flu shot given today.  Check labs.  Patient Counseling(The following topics were reviewed and/or handout was given):  -Nutrition: Stressed importance of moderation in sodium/caffeine intake, saturated fat and cholesterol, caloric balance, sufficient intake of fresh fruits, vegetables, and fiber.  -Stressed the importance of regular exercise.   -Substance Abuse: Discussed cessation/primary prevention of tobacco, alcohol, or other drug use; driving or other dangerous activities under the influence; availability of treatment for abuse.   -Injury prevention: Discussed safety belts, safety helmets, smoke detector, smoking near bedding or upholstery.   -Sexuality: Discussed sexually transmitted diseases, partner selection, use of condoms, avoidance of unintended pregnancy and contraceptive alternatives.   -Dental health:  Discussed importance of regular tooth brushing, flossing, and dental visits.  -Health maintenance and immunizations reviewed. Please refer to Health maintenance section.  Return to care in 1 year for next preventative visit.     Subjective:  HPI:  He has no acute complaints today. See A/p for status of chronic conditions.   He did have an episode of palpitations several months ago. He has not noticed much since then. He has had an issues with palpitations in the past.   Also had intermittent issues with knee pain right worse than left.  Comes and goes.  Sometimes feels some instability.  No obvious injuries or precipitating events.  No specific treatments tried.  Lifestyle Diet: Balanced.  Exercise: Limited.      06/16/2022   10:14 AM  Depression screen PHQ 2/9  Decreased Interest 0  Down, Depressed, Hopeless 0  PHQ - 2 Score 0    Health Maintenance Due  Topic Date Due   Hepatitis C Screening  Never done     ROS: Per HPI, otherwise a complete review of systems was negative.   PMH:  The following were reviewed and entered/updated in epic: History reviewed. No pertinent past medical history. Patient Active Problem List   Diagnosis Date Noted   Family history of bladder cancer 06/16/2022   Knee pain 06/16/2022   B12 deficiency 06/05/2020   Vitamin D deficiency 06/05/2020   Dyslipidemia 06/05/2020   Palpitation 04/02/2010   History reviewed. No pertinent surgical history.  Family History  Problem Relation Age of Onset   Hypertension Mother    Arrhythmia Mother    Bladder Cancer Brother    Cancer Maternal Aunt  Leukemia   Coronary artery disease Other        no premature cad or sudden death   Colon cancer Neg Hx    Stomach cancer Neg Hx    Pancreatic cancer Neg Hx     Medications- reviewed and updated Current Outpatient Medications  Medication Sig Dispense Refill   cetirizine (ZYRTEC) 10 MG tablet Take 10 mg by mouth daily. As needed     No  current facility-administered medications for this visit.    Allergies-reviewed and updated No Active Allergies   Social History   Socioeconomic History   Marital status: Single    Spouse name: Not on file   Number of children: Not on file   Years of education: Not on file   Highest education level: Not on file  Occupational History   Occupation: Part Time  Tobacco Use   Smoking status: Never   Smokeless tobacco: Never  Vaping Use   Vaping Use: Never used  Substance and Sexual Activity   Alcohol use: No   Drug use: Never   Sexual activity: Not on file  Other Topics Concern   Not on file  Social History Narrative   Not on file   Social Determinants of Health   Financial Resource Strain: Not on file  Food Insecurity: Not on file  Transportation Needs: Not on file  Physical Activity: Not on file  Stress: Not on file  Social Connections: Not on file        Objective:  Physical Exam: BP 111/73   Pulse 79   Temp 97.7 F (36.5 C) (Temporal)   Ht 6\' 2"  (1.88 m)   Wt 182 lb 6.4 oz (82.7 kg)   SpO2 99%   BMI 23.42 kg/m   Body mass index is 23.42 kg/m. Wt Readings from Last 3 Encounters:  06/16/22 182 lb 6.4 oz (82.7 kg)  06/10/21 182 lb 9.6 oz (82.8 kg)  05/06/21 180 lb 6.4 oz (81.8 kg)   Gen: NAD, resting comfortably HEENT: TMs normal bilaterally. OP clear. No thyromegaly noted.  CV: RRR with no murmurs appreciated Pulm: NWOB, CTAB with no crackles, wheezes, or rhonchi GI: Normal bowel sounds present. Soft, Nontender, Nondistended. MSK: no edema, cyanosis, or clubbing noted -Knees: No deformities.  Palpable click with active and passive range of motion bilaterally.  Stable to varus and valgus stress.  Posterior and anterior drawer signs negative bilaterally. Skin: warm, dry Neuro: CN2-12 grossly intact. Strength 5/5 in upper and lower extremities. Reflexes symmetric and intact bilaterally.  Psych: Normal affect and thought content     Caoilainn Sacks M. Jerline Pain,  MD 06/16/2022 10:59 AM

## 2022-06-16 NOTE — Assessment & Plan Note (Signed)
Check B12 

## 2022-06-16 NOTE — Assessment & Plan Note (Signed)
Check lipids. Discussed lifestyle modifications.  

## 2022-06-16 NOTE — Patient Instructions (Signed)
It was very nice to see you today!  We will check blood work today.  We give your flu shot today.  Please continue work on diet and exercise.  Please let us know if your palpitations worsen.  I will see back in year for your next physical.  Come back sooner if needed.  Take care, Dr Jimmey Ralph  PLEASE NOTE:  If you had any lab tests please let us know if you have not heard back within a few days. You may see your results on mychart before we have a chance to review them but we will give you a call once they are reviewed by [place]. If we ordered any referrals today, please let us know if you have not heard from their office within the next week.   Please try these tips to maintain a healthy lifestyle:  Eat at least 3 REAL meals and 1-2 snacks per day.  Aim for no more than 5 hours between eating.  If you eat breakfast, please do so within one hour of getting up.   Each meal should contain half fruits/vegetables, one quarter protein, and one quarter carbs (no bigger than a computer mouse)  Cut down on sweet beverages. This includes juice, soda, and sweet tea.   Drink at least 1 glass of water with each meal and aim for at least 8 glasses per day  Exercise at least 150 minutes every week.     Preventive Care 27-78 Years Old, Male   Preventive care refers to lifestyle choices and visits with your health care provider that can promote health and wellness. Preventive care visits are also called wellness exams. What can I expect for my preventive care visit? Counseling During your preventive care visit, your health care provider may ask about your: Medical history, including: Past medical problems. Family medical history. Current health, including: Emotional well-being. Home life and relationship well-being. Sexual activity. Lifestyle, including: Alcohol, nicotine or tobacco, and drug use. Access to firearms. Diet, exercise, and sleep habits. Safety issues such as seatbelt and bike  helmet use. Sunscreen use. Work and work Astronomer. Physical exam Your health care provider may check your: Height and weight. These may be used to calculate your BMI (body mass index). BMI is a measurement that tells if you are at a healthy weight. Waist circumference. This measures the distance around your waistline. This measurement also tells if you are at a healthy weight and may help predict your risk of certain diseases, such as type 2 diabetes and high blood pressure. Heart rate and blood pressure. Body temperature. Skin for abnormal spots. What immunizations do I need?  Vaccines are usually given at various ages, according to a schedule. Your health care provider will recommend vaccines for you based on your age, medical history, and lifestyle or other factors, such as travel or where you work. What tests do I need? Screening Your health care provider may recommend screening tests for certain conditions. This may include: Lipid and cholesterol levels. Diabetes screening. This is done by checking your blood sugar (glucose) after you have not eaten for a while (fasting). Hepatitis B test. Hepatitis C test. HIV (human immunodeficiency virus) test. STI (sexually transmitted infection) testing, if you are at risk. Talk with your health care provider about your test results, treatment options, and if necessary, the need for more tests. Follow these instructions at home: Eating and drinking  Eat a healthy diet that includes fresh fruits and vegetables, whole grains, lean protein, and low-fat  dairy products. Drink enough fluid to keep your urine pale yellow. Take vitamin and mineral supplements as recommended by your health care provider. Do not drink alcohol if your health care provider tells you not to drink. If you drink alcohol: Limit how much you have to 0-2 drinks a day. Know how much alcohol is in your drink. In the [place] one drink equals one 12 oz bottle of beer (355 mL),  one 5 oz glass of wine (148 mL), or one 1 oz glass of hard liquor (44 mL). Lifestyle Brush your teeth every morning and night with fluoride toothpaste. Floss one time each day. Exercise for at least 30 minutes 5 or more days each week. Do not use any products that contain nicotine or tobacco. These products include cigarettes, chewing tobacco, and vaping devices, such as e-cigarettes. If you need help quitting, ask your health care provider. Do not use drugs. If you are sexually active, practice safe sex. Use a condom or other form of protection to prevent STIs. Find healthy ways to manage stress, such as: Meditation, yoga, or listening to music. Journaling. Talking to a trusted person. Spending time with friends and family. Minimize exposure to UV radiation to reduce your risk of skin cancer. Safety Always wear your seat belt while driving or riding in a vehicle. Do not drive: If you have been drinking alcohol. Do not ride with someone who has been drinking. If you have been using any mind-altering substances or drugs. While texting. When you are tired or distracted. Wear a helmet and other protective equipment during sports activities. If you have firearms in your house, make sure you follow all gun safety procedures. Seek help if you have been physically or sexually abused. What's next? Go to your health care provider once a year for an annual wellness visit. Ask your health care provider how often you should have your eyes and teeth checked. Stay up to date on all vaccines. This information is not intended to replace advice given to you by your health care provider. Make sure you discuss any questions you have with your health care provider. Document Revised: 02/19/2021 Document Reviewed: 02/19/2021 Elsevier Patient Education  [place].   Premature Ventricular Contractions You will learn about what causes premature ventricular contractions, commonly felt symptoms to  watch for, and when you should seek medical care. To view the content, go to this web address: https://[email or web address].[email or web address]  This video will expire on: 02/28/2024. If you need access to this video following this date, please reach out to the healthcare provider who assigned it to you. This information is not intended to replace advice given to you by your health care provider. Make sure you discuss any questions you have with your health care provider. Elsevier Patient Education  [place].

## 2022-06-16 NOTE — Assessment & Plan Note (Signed)
No red flags.  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  Given mild nature of symptoms would not pursue any further work-up at this point.  We discussed home exercises.  He will let me know if symptoms worsen.

## 2022-06-16 NOTE — Assessment & Plan Note (Signed)
Brother was recently diagnosed with bladder cancer.  We discussed yearly UA however he deferred for now.  He will let me know if he develops any symptoms.

## 2022-06-17 LAB — HEPATITIS C ANTIBODY: Hepatitis C Ab: NONREACTIVE

## 2022-06-19 NOTE — Progress Notes (Signed)
Please inform patient of the following:  Vitamin D and B12 are both a bit low.  He can take 1000 IUs vitamin D and 1000 mcg of B12 daily.  We can recheck both these in about 6 months.  His bad cholesterol is little elevated but overall stable.  Do not need to start meds.  He should continue to work on diet and exercise.  Everything else is normal and we can recheck in a year.

## 2023-06-18 ENCOUNTER — Encounter: Payer: Self-pay | Admitting: Family Medicine

## 2023-06-18 ENCOUNTER — Ambulatory Visit: Payer: BLUE CROSS/BLUE SHIELD | Admitting: Family Medicine

## 2023-06-18 VITALS — BP 111/75 | HR 72 | Temp 98.2°F | Ht 74.0 in | Wt 179.4 lb

## 2023-06-18 DIAGNOSIS — E785 Hyperlipidemia, unspecified: Secondary | ICD-10-CM

## 2023-06-18 DIAGNOSIS — R739 Hyperglycemia, unspecified: Secondary | ICD-10-CM

## 2023-06-18 DIAGNOSIS — Z23 Encounter for immunization: Secondary | ICD-10-CM

## 2023-06-18 DIAGNOSIS — E559 Vitamin D deficiency, unspecified: Secondary | ICD-10-CM

## 2023-06-18 DIAGNOSIS — Z8052 Family history of malignant neoplasm of bladder: Secondary | ICD-10-CM

## 2023-06-18 DIAGNOSIS — E538 Deficiency of other specified B group vitamins: Secondary | ICD-10-CM | POA: Diagnosis not present

## 2023-06-18 DIAGNOSIS — Z0000 Encounter for general adult medical examination without abnormal findings: Secondary | ICD-10-CM

## 2023-06-18 LAB — COMPREHENSIVE METABOLIC PANEL
ALT: 18 U/L (ref 0–53)
AST: 13 U/L (ref 0–37)
Albumin: 4.4 g/dL (ref 3.5–5.2)
Alkaline Phosphatase: 95 U/L (ref 39–117)
BUN: 10 mg/dL (ref 6–23)
CO2: 31 meq/L (ref 19–32)
Calcium: 9.8 mg/dL (ref 8.4–10.5)
Chloride: 99 meq/L (ref 96–112)
Creatinine, Ser: 1.17 mg/dL (ref 0.40–1.50)
GFR: 78.47 mL/min (ref >60.00)
Glucose, Bld: 93 mg/dL (ref 70–99)
Potassium: 4 meq/L (ref 3.5–5.1)
Sodium: 136 meq/L (ref 135–145)
Total Bilirubin: 1 mg/dL (ref 0.2–1.2)
Total Protein: 6.8 g/dL (ref 6.0–8.3)

## 2023-06-18 LAB — LIPID PANEL
Cholesterol: 177 mg/dL (ref 0–200)
HDL: 39.5 mg/dL (ref >39.00)
LDL Cholesterol: 115 mg/dL — ABNORMAL HIGH (ref 0–99)
NonHDL: 137.08
Total CHOL/HDL Ratio: 4
Triglycerides: 108 mg/dL (ref 0.0–149.0)
VLDL: 21.6 mg/dL (ref 0.0–40.0)

## 2023-06-18 LAB — VITAMIN D 25 HYDROXY (VIT D DEFICIENCY, FRACTURES): VITD: 84.67 ng/mL (ref 30.00–100.00)

## 2023-06-18 LAB — TSH: TSH: 2.78 u[IU]/mL (ref 0.35–5.50)

## 2023-06-18 NOTE — Assessment & Plan Note (Signed)
Check lipids. Discussed lifestyle modifications.  

## 2023-06-18 NOTE — Patient Instructions (Signed)
It was very nice to see you today!  We will check blood work and a urine sample today.  We gave your tetanus and flu shot today.  Please continue to work on diet and exercise.  Return in about 1 year (around 06/17/2024) for Annual Physical.   Take care, Dr Jimmey Ralph  PLEASE NOTE:  If you had any lab tests, please let us know if you have not heard back within a few days. You may see your results on mychart before we have a chance to review them but we will give you a call once they are reviewed by [place].   If we ordered any referrals today, please let us know if you have not heard from their office within the next week.   If you had any urgent prescriptions sent in today, please check with the pharmacy within an hour of our visit to make sure the prescription was transmitted appropriately.   Please try these tips to maintain a healthy lifestyle:  Eat at least 3 REAL meals and 1-2 snacks per day.  Aim for no more than 5 hours between eating.  If you eat breakfast, please do so within one hour of getting up.   Each meal should contain half fruits/vegetables, one quarter protein, and one quarter carbs (no bigger than a computer mouse)  Cut down on sweet beverages. This includes juice, soda, and sweet tea.   Drink at least 1 glass of water with each meal and aim for at least 8 glasses per day  Exercise at least 150 minutes every week.    Preventive Care 75-32 Years Old, Male Preventive care refers to lifestyle choices and visits with your health care provider that can promote health and wellness. Preventive care visits are also called wellness exams. What can I expect for my preventive care visit? Counseling During your preventive care visit, your health care provider may ask about your: Medical history, including: Past medical problems. Family medical history. Current health, including: Emotional well-being. Home life and relationship well-being. Sexual activity. Lifestyle,  including: Alcohol, nicotine or tobacco, and drug use. Access to firearms. Diet, exercise, and sleep habits. Safety issues such as seatbelt and bike helmet use. Sunscreen use. Work and work Astronomer. Physical exam Your health care provider may check your: Height and weight. These may be used to calculate your BMI (body mass index). BMI is a measurement that tells if you are at a healthy weight. Waist circumference. This measures the distance around your waistline. This measurement also tells if you are at a healthy weight and may help predict your risk of certain diseases, such as type 2 diabetes and high blood pressure. Heart rate and blood pressure. Body temperature. Skin for abnormal spots. What immunizations do I need?  Vaccines are usually given at various ages, according to a schedule. Your health care provider will recommend vaccines for you based on your age, medical history, and lifestyle or other factors, such as travel or where you work. What tests do I need? Screening Your health care provider may recommend screening tests for certain conditions. This may include: Lipid and cholesterol levels. Diabetes screening. This is done by checking your blood sugar (glucose) after you have not eaten for a while (fasting). Hepatitis B test. Hepatitis C test. HIV (human immunodeficiency virus) test. STI (sexually transmitted infection) testing, if you are at risk. Talk with your health care provider about your test results, treatment options, and if necessary, the need for more tests. Follow these instructions  at home: Eating and drinking  Eat a healthy diet that includes fresh fruits and vegetables, whole grains, lean protein, and low-fat dairy products. Drink enough fluid to keep your urine pale yellow. Take vitamin and mineral supplements as recommended by your health care provider. Do not drink alcohol if your health care provider tells you not to drink. If you drink  alcohol: Limit how much you have to 0-2 drinks a day. Know how much alcohol is in your drink. In the [place] one drink equals one 12 oz bottle of beer (355 mL), one 5 oz glass of wine (148 mL), or one 1 oz glass of hard liquor (44 mL). Lifestyle Brush your teeth every morning and night with fluoride toothpaste. Floss one time each day. Exercise for at least 30 minutes 5 or more days each week. Do not use any products that contain nicotine or tobacco. These products include cigarettes, chewing tobacco, and vaping devices, such as e-cigarettes. If you need help quitting, ask your health care provider. Do not use drugs. If you are sexually active, practice safe sex. Use a condom or other form of protection to prevent STIs. Find healthy ways to manage stress, such as: Meditation, yoga, or listening to music. Journaling. Talking to a trusted person. Spending time with friends and family. Minimize exposure to UV radiation to reduce your risk of skin cancer. Safety Always wear your seat belt while driving or riding in a vehicle. Do not drive: If you have been drinking alcohol. Do not ride with someone who has been drinking. If you have been using any mind-altering substances or drugs. While texting. When you are tired or distracted. Wear a helmet and other protective equipment during sports activities. If you have firearms in your house, make sure you follow all gun safety procedures. Seek help if you have been physically or sexually abused. What's next? Go to your health care provider once a year for an annual wellness visit. Ask your health care provider how often you should have your eyes and teeth checked. Stay up to date on all vaccines. This information is not intended to replace advice given to you by your health care provider. Make sure you discuss any questions you have with your health care provider. Document Revised: 02/19/2021 Document Reviewed: 02/19/2021 Elsevier Patient  Education  2024 [employer].

## 2023-06-18 NOTE — Addendum Note (Signed)
Addended by: Lorn Junes on: 06/18/2023 10:58 AM   Modules accepted: Orders

## 2023-06-18 NOTE — Assessment & Plan Note (Signed)
Check B12 

## 2023-06-18 NOTE — Assessment & Plan Note (Signed)
Brother was diagnosed with bladder cancer.  This was treated.  We will check yearly UA for patient to look for any signs of development of hematuria.  He will let us know if he develops any symptoms between now and our next visit.

## 2023-06-18 NOTE — Progress Notes (Signed)
Chief Complaint:  Dennis Grant is a 37 y.o. male who presents today for his annual comprehensive physical exam.    Assessment/Plan:  Chronic Problems Addressed Today: B12 deficiency Check B12.   Vitamin D deficiency Check vitamin D.  Dyslipidemia Check lipids.  Discussed lifestyle modifications.  Family history of bladder cancer Brother was diagnosed with bladder cancer.  This was treated.  We will check yearly UA for patient to look for any signs of development of hematuria.  He will let us know if he develops any symptoms between now and our next visit.  Preventative Healthcare: Tetanus, Diphtheria, and Pertussis (Tdap) and flu shot given today.   Patient Counseling(The following topics were reviewed and/or handout was given):  -Nutrition: Stressed importance of moderation in sodium/caffeine intake, saturated fat and cholesterol, caloric balance, sufficient intake of fresh fruits, vegetables, and fiber.  -Stressed the importance of regular exercise.   -Substance Abuse: Discussed cessation/primary prevention of tobacco, alcohol, or other drug use; driving or other dangerous activities under the influence; availability of treatment for abuse.   -Injury prevention: Discussed safety belts, safety helmets, smoke detector, smoking near bedding or upholstery.   -Sexuality: Discussed sexually transmitted diseases, partner selection, use of condoms, avoidance of unintended pregnancy and contraceptive alternatives.   -Dental health: Discussed importance of regular tooth brushing, flossing, and dental visits.  -Health maintenance and immunizations reviewed. Please refer to Health maintenance section.  Return to care in 1 year for next preventative visit.     Subjective:  HPI:  He has no acute complaints today. See Assessment / plan for status of chronic conditions.   Lifestyle Diet: No specific.  Exercise: Playing drums about 45 minutes daily.      06/18/2023    9:41 AM   Depression screen PHQ 2/9  Decreased Interest 0  Down, Depressed, Hopeless 0  PHQ - 2 Score 0   ROS: Per HPI, otherwise a complete review of systems was negative.   PMH:  The following were reviewed and entered/updated in epic: History reviewed. No pertinent past medical history. Patient Active Problem List   Diagnosis Date Noted   Family history of bladder cancer 06/16/2022   Knee pain 06/16/2022   B12 deficiency 06/05/2020   Vitamin D deficiency 06/05/2020   Dyslipidemia 06/05/2020   Palpitation 04/02/2010   History reviewed. No pertinent surgical history.  Family History  Problem Relation Age of Onset   Hypertension Mother    Arrhythmia Mother    Cervical cancer Mother    Bladder Cancer Brother    Cancer Maternal Aunt        Leukemia   Coronary artery disease Other        no premature cad or sudden death   Colon cancer Neg Hx    Stomach cancer Neg Hx    Pancreatic cancer Neg Hx     Medications- reviewed and updated Current Outpatient Medications  Medication Sig Dispense Refill   cetirizine (ZYRTEC) 10 MG tablet Take 10 mg by mouth daily. As needed     cholecalciferol (VITAMIN D3) 25 MCG (1000 UNIT) tablet Take 1,000 Units by mouth daily.     cyanocobalamin (VITAMIN B12) 1000 MCG tablet Take 1,000 mcg by mouth daily.     No current facility-administered medications for this visit.    Allergies-reviewed and updated No Active Allergies  Social History   Socioeconomic History   Marital status: Single    Spouse name: Not on file   Number of children: Not on file  Years of education: Not on file   Highest education level: Not on file  Occupational History   Occupation: Part Time  Tobacco Use   Smoking status: Never   Smokeless tobacco: Never  Vaping Use   Vaping status: Never Used  Substance and Sexual Activity   Alcohol use: No   Drug use: Never   Sexual activity: Not on file  Other Topics Concern   Not on file  Social History Narrative   Not  on file   Social Determinants of Health   Financial Resource Strain: Not on file  Food Insecurity: Not on file  Transportation Needs: Not on file  Physical Activity: Not on file  Stress: Not on file  Social Connections: Not on file        Objective:  Physical Exam: BP 111/75   Pulse 72   Temp 98.2 F (36.8 C) (Temporal)   Ht 6\' 2"  (1.88 m)   Wt 179 lb 6.4 oz (81.4 kg)   SpO2 99%   BMI 23.03 kg/m   Body mass index is 23.03 kg/m. Wt Readings from Last 3 Encounters:  06/18/23 179 lb 6.4 oz (81.4 kg)  06/16/22 182 lb 6.4 oz (82.7 kg)  06/10/21 182 lb 9.6 oz (82.8 kg)   Gen: NAD, resting comfortably HEENT: TMs normal bilaterally. OP clear. No thyromegaly noted.  CV: RRR with no murmurs appreciated Pulm: NWOB, CTAB with no crackles, wheezes, or rhonchi GI: Normal bowel sounds present. Soft, Nontender, Nondistended. MSK: no edema, cyanosis, or clubbing noted Skin: warm, dry Neuro: CN2-12 grossly intact. Strength 5/5 in upper and lower extremities. Reflexes symmetric and intact bilaterally.  Psych: Normal affect and thought content     Jahmeer Porche M. Jimmey Ralph, MD 06/18/2023 10:39 AM

## 2023-06-18 NOTE — Assessment & Plan Note (Signed)
Check vitamin D. 

## 2023-06-19 LAB — VITAMIN B12: Vitamin B-12: 1043 pg/mL (ref 200–1100)

## 2023-06-21 NOTE — Progress Notes (Signed)
His cholesterol is up a bit since last time however stable.  The rest of his labs are all at goal.  He should continue to work on diet and exercise and we can recheck everything in a year or so.

## 2023-06-23 ENCOUNTER — Other Ambulatory Visit (INDEPENDENT_AMBULATORY_CARE_PROVIDER_SITE_OTHER): Payer: BLUE CROSS/BLUE SHIELD

## 2023-06-23 DIAGNOSIS — Z8052 Family history of malignant neoplasm of bladder: Secondary | ICD-10-CM | POA: Diagnosis not present

## 2023-06-23 DIAGNOSIS — Z0000 Encounter for general adult medical examination without abnormal findings: Secondary | ICD-10-CM | POA: Diagnosis not present

## 2023-06-23 DIAGNOSIS — R739 Hyperglycemia, unspecified: Secondary | ICD-10-CM

## 2023-06-23 LAB — URINALYSIS, ROUTINE W REFLEX MICROSCOPIC
Bilirubin Urine: NEGATIVE
Hgb urine dipstick: NEGATIVE
Ketones, ur: NEGATIVE
Leukocytes,Ua: NEGATIVE
Nitrite: NEGATIVE
RBC / HPF: NONE SEEN (ref 0–?)
Specific Gravity, Urine: 1.02 (ref 1.000–1.030)
Total Protein, Urine: NEGATIVE
Urine Glucose: NEGATIVE
Urobilinogen, UA: 0.2 (ref 0.0–1.0)
WBC, UA: NONE SEEN (ref 0–?)
pH: 6 (ref 5.0–8.0)

## 2023-06-23 LAB — CBC
HCT: 45.4 % (ref 39.0–52.0)
Hemoglobin: 15.5 g/dL (ref 13.0–17.0)
MCHC: 34.1 g/dL (ref 30.0–36.0)
MCV: 78.5 fL (ref 78.0–100.0)
Platelets: 192 10*3/uL (ref 150.0–400.0)
RBC: 5.77 Mil/uL (ref 4.22–5.81)
RDW: 14.3 % (ref 11.5–15.5)
WBC: 4.1 10*3/uL (ref 4.0–10.5)

## 2023-06-23 LAB — HEMOGLOBIN A1C: Hgb A1c MFr Bld: 4.6 % (ref 4.6–6.5)

## 2023-06-28 NOTE — Progress Notes (Signed)
Urine and blood results are normal. We can recheck in a year.

## 2023-07-09 DIAGNOSIS — Z419 Encounter for procedure for purposes other than remedying health state, unspecified: Secondary | ICD-10-CM | POA: Diagnosis not present

## 2023-08-08 DIAGNOSIS — Z419 Encounter for procedure for purposes other than remedying health state, unspecified: Secondary | ICD-10-CM | POA: Diagnosis not present

## 2023-09-08 DIAGNOSIS — Z419 Encounter for procedure for purposes other than remedying health state, unspecified: Secondary | ICD-10-CM | POA: Diagnosis not present

## 2023-10-09 DIAGNOSIS — Z419 Encounter for procedure for purposes other than remedying health state, unspecified: Secondary | ICD-10-CM | POA: Diagnosis not present

## 2023-11-06 DIAGNOSIS — Z419 Encounter for procedure for purposes other than remedying health state, unspecified: Secondary | ICD-10-CM | POA: Diagnosis not present

## 2023-12-18 DIAGNOSIS — Z419 Encounter for procedure for purposes other than remedying health state, unspecified: Secondary | ICD-10-CM | POA: Diagnosis not present

## 2024-01-17 DIAGNOSIS — Z419 Encounter for procedure for purposes other than remedying health state, unspecified: Secondary | ICD-10-CM | POA: Diagnosis not present

## 2024-01-28 ENCOUNTER — Encounter: Payer: Self-pay | Admitting: Family Medicine

## 2024-01-28 ENCOUNTER — Ambulatory Visit: Admitting: Family Medicine

## 2024-01-28 VITALS — BP 124/81 | HR 80 | Temp 97.6°F | Ht 74.0 in | Wt 180.8 lb

## 2024-01-28 DIAGNOSIS — R109 Unspecified abdominal pain: Secondary | ICD-10-CM

## 2024-01-28 LAB — URINALYSIS, ROUTINE W REFLEX MICROSCOPIC
Bilirubin Urine: NEGATIVE
Hgb urine dipstick: NEGATIVE
Ketones, ur: NEGATIVE
Leukocytes,Ua: NEGATIVE
Nitrite: NEGATIVE
RBC / HPF: NONE SEEN (ref 0–?)
Specific Gravity, Urine: 1.015 (ref 1.000–1.030)
Total Protein, Urine: NEGATIVE
Urine Glucose: NEGATIVE
Urobilinogen, UA: 0.2 (ref 0.0–1.0)
pH: 6.5 (ref 5.0–8.0)

## 2024-01-28 NOTE — Patient Instructions (Addendum)
 It was very nice to see you today!  I think you may have a hernia.   We will check a urine sample and get you set up for a CT scan to further evaluate.  Let us  know if you have any change in your symptoms.  Return if symptoms worsen or fail to improve.   Take care, Dr Daneil Dunker  PLEASE NOTE:  If you had any lab tests, please let us  know if you have not heard back within a few days. You may see your results on mychart before we have a chance to review them but we will give you a call once they are reviewed by us .   If we ordered any referrals today, please let us  know if you have not heard from their office within the next week.   If you had any urgent prescriptions sent in today, please check with the pharmacy within an hour of our visit to make sure the prescription was transmitted appropriately.   Please try these tips to maintain a healthy lifestyle:  Eat at least 3 REAL meals and 1-2 snacks per day.  Aim for no more than 5 hours between eating.  If you eat breakfast, please do so within one hour of getting up.   Each meal should contain half fruits/vegetables, one quarter protein, and one quarter carbs (no bigger than a computer mouse)  Cut down on sweet beverages. This includes juice, soda, and sweet tea.   Drink at least 1 glass of water with each meal and aim for at least 8 glasses per day  Exercise at least 150 minutes every week.

## 2024-01-28 NOTE — Progress Notes (Signed)
   Dennis Grant is a 38 y.o. male who presents today for an office visit.  Assessment/Plan:  Left Lower Quadrant Abdominal Pain  No red flags.  Concern for potential inguinal hernia given his physical exam findings.  Will check UA and urine culture to rule out kidney stone or UTI however given high degree of concern for hernia we will confirm this with CT scan.  Does not currently have any symptoms and overall reassuring exam -does not need emergent workup at this point.  We did discuss potential reasons to return to care and seek emergent care.  Depending on results of his above urine culture and CT scan may need referral to surgery.  It is okay for him to use over-the-counter medications including Tylenol  and ibuprofen as needed.    Subjective:  HPI:  See assessment / plan for status of chronic conditions. Patient is here today with episodic left lower quadrant pain. This happened about a week ago with pain radiating into his groin and testicle. He has also noticed more lower abdominal pain and pressure.  Symptoms did gradually improve over a few days however had a recurrence of pain in his lower abdomen and radiating into his left testicle. Feels like more of a pressure sensation. He has had more urinary frequency. No constipation. He has had some diarrhea but he attributes this to anxiety. Some decreased appetite. No hematuria. No blood in stool.        Objective:  Physical Exam: BP 124/81   Pulse 80   Temp 97.6 F (36.4 C) (Temporal)   Ht 6\' 2"  (1.88 m)   Wt 180 lb 12.8 oz (82 kg)   SpO2 99%   BMI 23.21 kg/m   Gen: No acute distress, resting comfortably CV: Regular rate and rhythm with no murmurs appreciated Pulm: Normal work of breathing, clear to auscultation bilaterally with no crackles, wheezes, or rhonchi Abdomen: Soft, nondistended.  Tender to palpation in left lower quadrant.  Bowel sounds present.  No rebound or guarding GU: Normal male genitalia.  Palpable bulge with  Valsalva noted bilateral inguinal canal. Neuro: Grossly normal, moves all extremities Psych: Normal affect and thought content      Jaxtyn Linville M. Daneil Dunker, MD 01/28/2024 11:43 AM

## 2024-01-29 LAB — URINE CULTURE
MICRO NUMBER:: 16495052
Result:: NO GROWTH
SPECIMEN QUALITY:: ADEQUATE

## 2024-02-01 ENCOUNTER — Ambulatory Visit: Payer: Self-pay | Admitting: Family Medicine

## 2024-02-01 NOTE — Progress Notes (Signed)
 Urine culture is negative.  He does not have a UTI.  Can we check on the status of his CT scan?

## 2024-02-11 ENCOUNTER — Telehealth: Payer: Self-pay | Admitting: Family Medicine

## 2024-02-11 NOTE — Telephone Encounter (Signed)
 Melissa called from [facility] pre service center requesting to speak with whoever deals with prior authorizations. Caller states patient is scheduled for CT on 6/11 but it needs a pre-authorization. Please advise.

## 2024-02-14 NOTE — Telephone Encounter (Signed)
 Information send to [facility], referral coordinator

## 2024-02-16 ENCOUNTER — Ambulatory Visit (HOSPITAL_BASED_OUTPATIENT_CLINIC_OR_DEPARTMENT_OTHER)
Admission: RE | Admit: 2024-02-16 | Discharge: 2024-02-16 | Disposition: A | Discharge status: Home / Self Care | Source: Ambulatory Visit | Attending: Family Medicine | Admitting: Family Medicine

## 2024-02-16 DIAGNOSIS — R109 Unspecified abdominal pain: Secondary | ICD-10-CM | POA: Diagnosis not present

## 2024-02-16 DIAGNOSIS — R1032 Left lower quadrant pain: Secondary | ICD-10-CM | POA: Diagnosis not present

## 2024-02-16 DIAGNOSIS — K5730 Diverticulosis of large intestine without perforation or abscess without bleeding: Secondary | ICD-10-CM | POA: Diagnosis not present

## 2024-02-16 MED ORDER — IOHEXOL 300 MG/ML  SOLN
100.0000 mL | Freq: Once | INTRAMUSCULAR | Status: AC | PRN
  Administered 2024-02-16: 100 mL via INTRAVENOUS

## 2024-02-17 DIAGNOSIS — Z419 Encounter for procedure for purposes other than remedying health state, unspecified: Secondary | ICD-10-CM | POA: Diagnosis not present

## 2024-02-21 NOTE — Progress Notes (Signed)
 His CT scan did not reveal any hernia.  It is possible that his abdominal pain may be related to a muscular strain or other muscular issue.  If he is still having symptoms recommend referral to sports medicine.

## 2024-03-18 DIAGNOSIS — Z419 Encounter for procedure for purposes other than remedying health state, unspecified: Secondary | ICD-10-CM | POA: Diagnosis not present

## 2024-04-18 DIAGNOSIS — Z419 Encounter for procedure for purposes other than remedying health state, unspecified: Secondary | ICD-10-CM | POA: Diagnosis not present

## 2024-05-19 DIAGNOSIS — Z419 Encounter for procedure for purposes other than remedying health state, unspecified: Secondary | ICD-10-CM | POA: Diagnosis not present

## 2024-06-16 ENCOUNTER — Ambulatory Visit (INDEPENDENT_AMBULATORY_CARE_PROVIDER_SITE_OTHER): Admitting: Family Medicine

## 2024-06-16 ENCOUNTER — Ambulatory Visit: Payer: Self-pay

## 2024-06-16 VITALS — BP 110/94 | HR 78 | Temp 98.5°F | Resp 16 | Ht 74.0 in | Wt 183.2 lb

## 2024-06-16 DIAGNOSIS — R030 Elevated blood-pressure reading, without diagnosis of hypertension: Secondary | ICD-10-CM

## 2024-06-16 DIAGNOSIS — R002 Palpitations: Secondary | ICD-10-CM

## 2024-06-16 DIAGNOSIS — I4510 Unspecified right bundle-branch block: Secondary | ICD-10-CM | POA: Insufficient documentation

## 2024-06-16 NOTE — Progress Notes (Signed)
 ACUTE VISIT Chief Complaint  Patient presents with   Palpitations    Pt reports he felt more like  pressure, strong  pressure and fast beat. Notice it more at night and when laying down. Affect his sleep. Hear it skip a beat. Something that's going on and off. Was seen with cardiology before. notice it more in the last 2 wks as Dad had a heart attack 2 wks. He survived. Not sure if it is due to anxiety. Denied sob.    Discussed the use of AI scribe software for clinical note transcription with the patient, who gave verbal consent to proceed.  History of Present Illness Dennis Grant is a 38 year old male with past medical history significant for vitamin D  deficiency, dyslipidemia, and B12 deficiency who is here today complaining of episodes of palpitations and chest pressure as described above.  He experiences episodes of palpitations described as a strong pressure or 'knock' in his chest. These episodes occur occasionally, regardless of activity, and are more noticeable when lying down, particularly at night. He describes the sensation as his heart beating harder, not faster, and sometimes perceives a skipped beat followed by a heavier beat. These symptoms have been more frequent over the past two weeks, with episodes lasting about a minute before fading, though they can recur several times a day. He has not been woken up by symptoms but notice them if he wakes up.  He has a history of similar symptoms for which he consulted a cardiologist approximately ten years ago. The symptoms have been intermittent over the years but have become more noticeable recently.  No associated chest pain, difficulty breathing, or lightheadedness, though he occasionally feels a brief pressure or sensation akin to a pulled muscle. He has not experienced any issues during physical exertion and has not increased his caffeine intake. He has not tried OTC remedies.  His family history is significant for  cardiovascular issues; his father recently had a heart attack (16 yo), and his mother has a history of atrial fibrillation. He acknowledges that his father's recent heart attack may have heightened his awareness and anxiety regarding his own symptoms.  No symptoms of heartburn, acid reflux, or increased burping beyond his usual.   Lab Results  Component Value Date   CHOL 177 06/18/2023   HDL 39.50 06/18/2023   LDLCALC 115 (H) 06/18/2023   TRIG 108.0 06/18/2023   CHOLHDL 4 06/18/2023  BP mildly elevated today, no history of hypertension. Lab Results  Component Value Date   NA 136 06/18/2023   CL 99 06/18/2023   K 4.0 06/18/2023   CO2 31 06/18/2023   BUN 10 06/18/2023   CREATININE 1.17 06/18/2023   GFR 78.47 06/18/2023   CALCIUM 9.8 06/18/2023   ALBUMIN 4.4 06/18/2023   GLUCOSE 93 06/18/2023   Review of Systems  Constitutional:  Negative for activity change, appetite change, chills and fever.  HENT:  Negative for sore throat and trouble swallowing.   Respiratory:  Negative for cough, wheezing and stridor.   Cardiovascular:  Negative for leg swelling.  Gastrointestinal:  Negative for abdominal pain, nausea and vomiting.  Skin:  Negative for rash.  Neurological:  Negative for syncope, facial asymmetry and weakness.  Psychiatric/Behavioral:  Negative for confusion and hallucinations.   See other pertinent positives and negatives in HPI.  Current Outpatient Medications on File Prior to Visit  Medication Sig Dispense Refill   cetirizine (ZYRTEC) 10 MG tablet Take 10 mg by mouth daily. As  needed     cholecalciferol  (VITAMIN D3) 25 MCG (1000 UNIT) tablet Take 1,000 Units by mouth daily.     cyanocobalamin  (VITAMIN B12) 1000 MCG tablet Take 1,000 mcg by mouth daily.     Probiotic Product (PROBIOTIC PO) Take by mouth.     No current facility-administered medications on file prior to visit.   No past medical history on file. No Known Allergies  Social History   Socioeconomic  History   Marital status: Single    Spouse name: Not on file   Number of children: Not on file   Years of education: Not on file   Highest education level: Associate degree: occupational, Scientist, product/process development, or vocational program  Occupational History   Occupation: Part Time  Tobacco Use   Smoking status: Never   Smokeless tobacco: Never  Vaping Use   Vaping status: Never Used  Substance and Sexual Activity   Alcohol use: No   Drug use: Never   Sexual activity: Not on file  Other Topics Concern   Not on file  Social History Narrative   Not on file   Social Drivers of Health   Financial Resource Strain: Low Risk  (06/16/2024)   Overall Financial Resource Strain (CARDIA)    Difficulty of Paying Living Expenses: Not hard at all  Food Insecurity: No Food Insecurity (06/16/2024)   Hunger Vital Sign    Worried About Running Out of Food in the Last Year: Never true    Ran Out of Food in the Last Year: Never true  Transportation Needs: No Transportation Needs (06/16/2024)   PRAPARE - Administrator, Civil Service (Medical): No    Lack of Transportation (Non-Medical): No  Physical Activity: Inactive (06/16/2024)   Exercise Vital Sign    Days of Exercise per Week: 0 days    Minutes of Exercise per Session: Not on file  Stress: No Stress Concern Present (06/16/2024)   [employer] of Occupational Health - Occupational Stress Questionnaire    Feeling of Stress: Only a little  Social Connections: Socially Isolated (06/16/2024)   Social Connection and Isolation Panel    Frequency of Communication with Friends and Family: Once a week    Frequency of Social Gatherings with Friends and Family: More than three times a week    Attends Religious Services: Never    Database administrator or Organizations: No    Attends Banker Meetings: Not on file    Marital Status: Never married   Vitals:   06/16/24 1043  BP: (!) 110/94  Pulse: 78  Resp: 16  Temp: 98.5 F  (36.9 C)  SpO2: 98%   Body mass index is 23.52 kg/m.  Physical Exam Nursing note reviewed.  Constitutional:      General: He is not in acute distress.    Appearance: He is well-developed.  HENT:     Head: Normocephalic and atraumatic.  Eyes:     Conjunctiva/sclera: Conjunctivae normal.  Cardiovascular:     Rate and Rhythm: Normal rate and regular rhythm.     Pulses:          Posterior tibial pulses are 2+ on the right side and 2+ on the left side.     Heart sounds: No murmur heard. Pulmonary:     Effort: Pulmonary effort is normal. No respiratory distress.     Breath sounds: Normal breath sounds.  Abdominal:     Palpations: Abdomen is soft. There is no mass.  Tenderness: There is no abdominal tenderness.  Musculoskeletal:     Right lower leg: No edema.     Left lower leg: No edema.  Lymphadenopathy:     Cervical: No cervical adenopathy.  Skin:    General: Skin is warm.     Findings: No erythema or rash.  Neurological:     Mental Status: He is alert and oriented to person, place, and time.     Cranial Nerves: No cranial nerve deficit.     Gait: Gait normal.  Psychiatric:        Mood and Affect: Affect normal. Mood is anxious.   ASSESSMENT AND PLAN:  Mr. Meyers was seen today for palpitations and chest pressure sensation.  Palpitation Occasionally associated with chest pressure. We discussed possible etiologies. History and examination today do not suggest a serious cardiac arrhythmia. EKG done today: Normal sinus rhythm, normal axis and intervals, incomplete RBBB.  No changes when compared with EKG done in 03/2010. We discussed options, he prefers to hold on blood work or cardiac referral until he sees his PCP, he has an appointment coming next week. Clearly instructed about warning signs.  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 We discussed diagnosis, which was also discussed with cardiology in 03/2010.  Elevated blood  pressure reading Mildly elevated DBP. Recommend monitoring BP at home. Follow-up with PCP.  Return if symptoms worsen or fail to improve, for keep next appointment.  Lurena Naeve G. [place], MD  [facility]. [place] office.

## 2024-06-16 NOTE — Telephone Encounter (Signed)
 Spoke with patient, seen at [facility] office  Has a F/U appt with PCP on 06/21/2023 Advise if symptoms worsen to to to ED  Patient verbalized understanding

## 2024-06-16 NOTE — Patient Instructions (Addendum)
 A few things to remember from today's visit:  Palpitation - Plan: EKG 12-Lead  Incomplete right bundle branch block (RBBB)  Elevated blood pressure reading Continue monitoring for new symptoms. Keep appt with PCP next week. EKG today similar to the one you had in 2011. Monitor blood pressure at home.  If you need refills for medications you take chronically, please call your pharmacy. Do not use My Chart to request refills or for acute issues that need immediate attention. If you send a my chart message, it may take a few days to be addressed, specially if I am not in the office.  Please be sure medication list is accurate. If a new problem present, please set up appointment sooner than planned today.

## 2024-06-16 NOTE — Telephone Encounter (Signed)
 FYI Only or Action Required?: FYI only for provider.  Patient was last seen in primary care on 01/28/2024 by Dennis Worth HERO, MD.  Called Nurse Triage reporting Chest Pain.  Symptoms began a week ago.  Interventions attempted: Nothing.  Symptoms are: stable.  Triage Disposition: See HCP Within 4 Hours (Or PCP Triage)  Patient/caregiver understands and will follow disposition?: Yes  **Appt. Scheduled for 10/10**         Copied from CRM #[id number]. Topic: Clinical - Red Word Triage >> Jun 16, 2024  8:04 AM [place] A wrote: [facility] that prompted transfer to Nurse Triage: Patient feels heart rate is usually high and has soreness in chest area. Has been for this week Reason for Disposition  [1] Skipped or extra beat(s) AND [2] occurs 4 or more times per minute  Answer Assessment - Initial Assessment Questions 1. DESCRIPTION: Please describe your heart rate or heartbeat that you are having (e.g., fast/slow, regular/irregular, skipped or extra beats, palpitations)      He states not palpitations, just an intense heart beat, says PCP is aware    2. ONSET: When did it start? (e.g., minutes, hours, days)      X 1 week, ongoing x 1 year   3. DURATION: How long does it last (e.g., seconds, minutes, hours)     Less than one minute   4. PATTERN Does it come and go, or has it been constant since it started?  Does it get worse with exertion?   Are you feeling it now?     Intermittent    6. HEART RATE: Can you tell me your heart rate? How many beats in 15 seconds?  Note: Not all patients can do this.       Normal   7. RECURRENT SYMPTOM: Have you ever had this before? If Yes, ask: When was the last time? and What happened that time?      Yes ongoing x 1 year intermittently   8. CAUSE: What do you think is causing the palpitations?      No, providers note no abnormal findings   9. CARDIAC HISTORY: Do you have any history of heart disease? (e.g.,  heart attack, angina, bypass surgery, angioplasty, arrhythmia)      No   10. OTHER SYMPTOMS: Do you have any other symptoms? (e.g., dizziness, chest pain, sweating, difficulty breathing)  No   Appt. Scheduled for 10/10 at [facility] office.  Protocols used: Heart Rate and Heartbeat Questions-A-AH

## 2024-06-16 NOTE — Telephone Encounter (Signed)
 Left message to return call to our office at their convenience.

## 2024-06-18 DIAGNOSIS — Z419 Encounter for procedure for purposes other than remedying health state, unspecified: Secondary | ICD-10-CM | POA: Diagnosis not present

## 2024-06-19 ENCOUNTER — Telehealth: Payer: Self-pay | Admitting: *Deleted

## 2024-06-19 NOTE — Telephone Encounter (Signed)
 Copied from CRM [id number]. Topic: Clinical - Request for Lab/Test Order >> Jun 19, 2024  8:06 AM Adelita E wrote: Reason for CRM: Patient has his physical tomorrow, 10/15, but he would rather get his labs done today. Patient questioning if orders can be entered for him to come in for a lab only sometime this afternoon. Callback number [phone].   Please advise  Aleksa Collinsworth,RMA

## 2024-06-20 ENCOUNTER — Other Ambulatory Visit: Payer: Self-pay | Admitting: Family Medicine

## 2024-06-20 ENCOUNTER — Ambulatory Visit: Payer: BLUE CROSS/BLUE SHIELD | Admitting: Family Medicine

## 2024-06-20 ENCOUNTER — Encounter: Payer: Self-pay | Admitting: Family Medicine

## 2024-06-20 ENCOUNTER — Ambulatory Visit: Attending: Family Medicine

## 2024-06-20 VITALS — BP 130/78 | HR 71 | Temp 97.5°F | Ht 74.0 in | Wt 182.2 lb

## 2024-06-20 DIAGNOSIS — E538 Deficiency of other specified B group vitamins: Secondary | ICD-10-CM

## 2024-06-20 DIAGNOSIS — Z8052 Family history of malignant neoplasm of bladder: Secondary | ICD-10-CM

## 2024-06-20 DIAGNOSIS — E559 Vitamin D deficiency, unspecified: Secondary | ICD-10-CM

## 2024-06-20 DIAGNOSIS — Z0001 Encounter for general adult medical examination with abnormal findings: Secondary | ICD-10-CM

## 2024-06-20 DIAGNOSIS — Z131 Encounter for screening for diabetes mellitus: Secondary | ICD-10-CM

## 2024-06-20 DIAGNOSIS — R002 Palpitations: Secondary | ICD-10-CM

## 2024-06-20 DIAGNOSIS — Z23 Encounter for immunization: Secondary | ICD-10-CM | POA: Diagnosis not present

## 2024-06-20 DIAGNOSIS — E785 Hyperlipidemia, unspecified: Secondary | ICD-10-CM

## 2024-06-20 LAB — COMPREHENSIVE METABOLIC PANEL WITH GFR
ALT: 21 U/L (ref 0–53)
AST: 14 U/L (ref 0–37)
Albumin: 4.7 g/dL (ref 3.5–5.2)
Alkaline Phosphatase: 91 U/L (ref 39–117)
BUN: 10 mg/dL (ref 6–23)
CO2: 31 meq/L (ref 19–32)
Calcium: 9.4 mg/dL (ref 8.4–10.5)
Chloride: 102 meq/L (ref 96–112)
Creatinine, Ser: 1.21 mg/dL (ref 0.40–1.50)
GFR: 74.83 mL/min (ref >60.00)
Glucose, Bld: 85 mg/dL (ref 70–99)
Potassium: 3.6 meq/L (ref 3.5–5.1)
Sodium: 140 meq/L (ref 135–145)
Total Bilirubin: 0.8 mg/dL (ref 0.2–1.2)
Total Protein: 7.3 g/dL (ref 6.0–8.3)

## 2024-06-20 LAB — URINALYSIS, ROUTINE W REFLEX MICROSCOPIC
Bilirubin Urine: NEGATIVE
Hgb urine dipstick: NEGATIVE
Ketones, ur: NEGATIVE
Leukocytes,Ua: NEGATIVE
Nitrite: NEGATIVE
RBC / HPF: NONE SEEN (ref 0–?)
Specific Gravity, Urine: 1.015 (ref 1.000–1.030)
Total Protein, Urine: NEGATIVE
Urine Glucose: NEGATIVE
Urobilinogen, UA: 0.2 (ref 0.0–1.0)
WBC, UA: NONE SEEN (ref 0–?)
pH: 6.5 (ref 5.0–8.0)

## 2024-06-20 LAB — LIPID PANEL
Cholesterol: 171 mg/dL (ref 0–200)
HDL: 39.4 mg/dL (ref >39.00)
LDL Cholesterol: 104 mg/dL — ABNORMAL HIGH (ref 0–99)
NonHDL: 131.12
Total CHOL/HDL Ratio: 4
Triglycerides: 135 mg/dL (ref 0.0–149.0)
VLDL: 27 mg/dL (ref 0.0–40.0)

## 2024-06-20 LAB — CBC
HCT: 43.2 % (ref 39.0–52.0)
Hemoglobin: 15.3 g/dL (ref 13.0–17.0)
MCHC: 35.5 g/dL (ref 30.0–36.0)
MCV: 77.6 fl — ABNORMAL LOW (ref 78.0–100.0)
Platelets: 190 K/uL (ref 150.0–400.0)
RBC: 5.56 Mil/uL (ref 4.22–5.81)
RDW: 15 % (ref 11.5–15.5)
WBC: 3.7 K/uL — ABNORMAL LOW (ref 4.0–10.5)

## 2024-06-20 LAB — TSH: TSH: 2.15 u[IU]/mL (ref 0.35–5.50)

## 2024-06-20 LAB — VITAMIN B12: Vitamin B-12: 481 pg/mL (ref 211–911)

## 2024-06-20 LAB — HEMOGLOBIN A1C: Hgb A1c MFr Bld: 4.5 % — ABNORMAL LOW (ref 4.6–6.5)

## 2024-06-20 LAB — VITAMIN D 25 HYDROXY (VIT D DEFICIENCY, FRACTURES): VITD: 38.88 ng/mL (ref 30.00–100.00)

## 2024-06-20 NOTE — Assessment & Plan Note (Signed)
 This has been going on for few years but symptoms seem to have worsened recently.  He was seen at a different office and had EKG which was normal.  Based on history he is likely having ectopic beats.  Will check labs and Holter monitoring.  Depending on results may need referral to cardiology.  We discussed reasons to return to care and seek emergent care.

## 2024-06-20 NOTE — Progress Notes (Signed)
 Chief Complaint:  Dennis Grant is a 37 y.o. male who presents today for his annual comprehensive physical exam.    Assessment/Plan:  Chronic Problems Addressed Today: Palpitation This has been going on for few years but symptoms seem to have worsened recently.  He was seen at a different office and had EKG which was normal.  Based on history he is likely having ectopic beats.  Will check labs and Holter monitoring.  Depending on results may need referral to cardiology.  We discussed reasons to return to care and seek emergent care.  Family history of bladder cancer Check UA.  We are checking this yearly due to his brother who was diagnosed with bladder cancer.  B12 deficiency Check B12 with labs.  Vitamin D  deficiency Check vitamin D .  Dyslipidemia Check lipids.  Discussed lifestyle modifications.  Preventative Healthcare: Check labs.  Flu shot given today.  Patient Counseling(The following topics were reviewed and/or handout was given):  -Nutrition: Stressed importance of moderation in sodium/caffeine intake, saturated fat and cholesterol, caloric balance, sufficient intake of fresh fruits, vegetables, and fiber.  -Stressed the importance of regular exercise.   -Substance Abuse: Discussed cessation/primary prevention of tobacco, alcohol, or other drug use; driving or other dangerous activities under the influence; availability of treatment for abuse.   -Injury prevention: Discussed safety belts, safety helmets, smoke detector, smoking near bedding or upholstery.   -Sexuality: Discussed sexually transmitted diseases, partner selection, use of condoms, avoidance of unintended pregnancy and contraceptive alternatives.   -Dental health: Discussed importance of regular tooth brushing, flossing, and dental visits.  -Health maintenance and immunizations reviewed. Please refer to Health maintenance section.  Return to care in 1 year for next preventative visit.     Subjective:   HPI:  He has no acute complaints today. Patient is here today for his annual physical.  See assessment / plan for status of chronic conditions.  Discussed the use of AI scribe software for clinical note transcription with the patient, who gave verbal consent to proceed.  History of Present Illness Dennis Grant is a 38 year old male who presents with heart palpitations and numbness in the arms.  He has been experiencing heart palpitations that have increased in frequency over the past week. The palpitations are intermittent, sometimes accompanied by a sensation similar to muscle spasms, and occasionally mildly painful. He describes the palpitations as strong and sometimes rapid, but not consistently so. No dizziness is reported, but he feels flushed and sweaty during some episodes.  He has been experiencing poor sleep, particularly noting a rough night prior to the visit, and feels tired for months, attributing this partly to recent stress related to his father's health. About two and a half weeks ago, his father had a heart attack, which may have heightened his awareness of his own symptoms.  His family history includes his father recently having a heart attack and his mother having heart issues related to low potassium levels.  Socially, he has adjusted his diet to include more fruits and vegetables and has been monitoring his weight, noting a slight loss recently. He has reduced his exercise since storing away his drums, which he used to play regularly for physical activity.      06/20/2024   11:03 AM  Depression screen PHQ 2/9  Decreased Interest 0  Down, Depressed, Hopeless 0  PHQ - 2 Score 0    Health Maintenance Due  Topic Date Due   Influenza Vaccine  04/07/2024  ROS: Per HPI, otherwise a complete review of systems was negative.   PMH:  The following were reviewed and entered/updated in epic: History reviewed. No pertinent past medical history. Patient Active  Problem List   Diagnosis Date Noted   Incomplete right bundle branch block (RBBB) 06/16/2024   Family history of bladder cancer 06/16/2022   Knee pain 06/16/2022   B12 deficiency 06/05/2020   Vitamin D  deficiency 06/05/2020   Dyslipidemia 06/05/2020   Palpitation 04/02/2010   History reviewed. No pertinent surgical history.  Family History  Problem Relation Age of Onset   Hypertension Mother    Arrhythmia Mother    Cervical cancer Mother    Bladder Cancer Brother    Cancer Maternal Aunt        Leukemia   Coronary artery disease Other        no premature cad or sudden death   Colon cancer Neg Hx    Stomach cancer Neg Hx    Pancreatic cancer Neg Hx     Medications- reviewed and updated Current Outpatient Medications  Medication Sig Dispense Refill   cetirizine (ZYRTEC) 10 MG tablet Take 10 mg by mouth daily. As needed     cholecalciferol  (VITAMIN D3) 25 MCG (1000 UNIT) tablet Take 1,000 Units by mouth daily.     cyanocobalamin  (VITAMIN B12) 1000 MCG tablet Take 1,000 mcg by mouth daily.     Probiotic Product (PROBIOTIC PO) Take by mouth.     No current facility-administered medications for this visit.    Allergies-reviewed and updated No Known Allergies  Social History   Socioeconomic History   Marital status: Single    Spouse name: Not on file   Number of children: Not on file   Years of education: Not on file   Highest education level: Associate degree: occupational, Scientist, product/process development, or vocational program  Occupational History   Occupation: Part Time  Tobacco Use   Smoking status: Never   Smokeless tobacco: Never  Vaping Use   Vaping status: Never Used  Substance and Sexual Activity   Alcohol use: No   Drug use: Never   Sexual activity: Not on file  Other Topics Concern   Not on file  Social History Narrative   Not on file   Social Drivers of Health   Financial Resource Strain: Low Risk  (06/16/2024)   Overall Financial Resource Strain (CARDIA)     Difficulty of Paying Living Expenses: Not hard at all  Food Insecurity: No Food Insecurity (06/16/2024)   Hunger Vital Sign    Worried About Running Out of Food in the Last Year: Never true    Ran Out of Food in the Last Year: Never true  Transportation Needs: No Transportation Needs (06/16/2024)   PRAPARE - Administrator, Civil Service (Medical): No    Lack of Transportation (Non-Medical): No  Physical Activity: Inactive (06/16/2024)   Exercise Vital Sign    Days of Exercise per Week: 0 days    Minutes of Exercise per Session: Not on file  Stress: No Stress Concern Present (06/16/2024)   [employer] of Occupational Health - Occupational Stress Questionnaire    Feeling of Stress: Only a little  Social Connections: Socially Isolated (06/16/2024)   Social Connection and Isolation Panel    Frequency of Communication with Friends and Family: Once a week    Frequency of Social Gatherings with Friends and Family: More than three times a week    Attends Religious Services: Never  Active Member of Clubs or Organizations: No    Attends Engineer, structural: Not on file    Marital Status: Never married        Objective:  Physical Exam: BP 130/78   Pulse 71   Temp (!) 97.5 F (36.4 C) (Temporal)   Ht 6' 2 (1.88 m)   Wt 182 lb 3.2 oz (82.6 kg)   SpO2 100%   BMI 23.39 kg/m   Body mass index is 23.39 kg/m. Wt Readings from Last 3 Encounters:  06/20/24 182 lb 3.2 oz (82.6 kg)  06/16/24 183 lb 3.2 oz (83.1 kg)  01/28/24 180 lb 12.8 oz (82 kg)   Gen: NAD, resting comfortably HEENT: TMs normal bilaterally. OP clear. No thyromegaly noted.  CV: RRR with no murmurs appreciated Pulm: NWOB, CTAB with no crackles, wheezes, or rhonchi GI: Normal bowel sounds present. Soft, Nontender, Nondistended. MSK: no edema, cyanosis, or clubbing noted Skin: warm, dry Neuro: CN2-12 grossly intact. Strength 5/5 in upper and lower extremities. Reflexes symmetric and intact  bilaterally.  Psych: Normal affect and thought content     Madalene Mickler M. Kennyth, MD 06/20/2024 11:42 AM

## 2024-06-20 NOTE — Assessment & Plan Note (Signed)
 Check lipids. Discussed lifestyle modifications.

## 2024-06-20 NOTE — Assessment & Plan Note (Signed)
 Check UA.  We are checking this yearly due to his brother who was diagnosed with bladder cancer.

## 2024-06-20 NOTE — Assessment & Plan Note (Signed)
 Check B12 with labs.

## 2024-06-20 NOTE — Telephone Encounter (Signed)
 Received this message too late in the day. We will have to get labs at his appointment this morning.  Worth HERO. Kennyth, MD 06/20/2024 9:41 AM

## 2024-06-20 NOTE — Assessment & Plan Note (Signed)
 Check vitamin D.

## 2024-06-20 NOTE — Progress Notes (Unsigned)
 EP to read.

## 2024-06-20 NOTE — Patient Instructions (Signed)
 It was very nice to see you today!  VISIT SUMMARY: Today, we discussed your heart palpitations and numbness in your arms. We also reviewed your general health maintenance and immunization status.  YOUR PLAN: PALPITATIONS AND EPISODIC CHEST DISCOMFORT: You have been experiencing intermittent heart palpitations and chest discomfort, which may be due to stress and electrolyte imbalance. -Your EKG is normal, but your blood pressure is elevated. We will monitor your symptoms closely. -We have ordered blood work to check your B12, vitamin D , electrolytes, and thyroid  function. -You will wear a Zio patch for cardiac monitoring. -Be aware of the warning signs of an acute cardiac event and seek immediate help if they occur. -We discussed the psychological factors that might be contributing to your symptoms and reassured you about benign premature beats.  GENERAL HEALTH MAINTENANCE: You are maintaining your weight with dietary adjustments, but your exercise has decreased. -Continue with a balanced diet that includes more fruits and vegetables and less sugar and carbs. -Aim for 30 minutes of exercise per day. -Consider getting a new seat for your exercise bike to make it more comfortable.  Return in about 1 year (around 06/20/2025) for Annual Physical.   Take care, Dr Kennyth  PLEASE NOTE:  If you had any lab tests, please let us  know if you have not heard back within a few days. You may see your results on mychart before we have a chance to review them but we will give you a call once they are reviewed by us .   If we ordered any referrals today, please let us  know if you have not heard from their office within the next week.   If you had any urgent prescriptions sent in today, please check with the pharmacy within an hour of our visit to make sure the prescription was transmitted appropriately.   Please try these tips to maintain a healthy lifestyle:  Eat at least 3 REAL meals and 1-2 snacks  per day.  Aim for no more than 5 hours between eating.  If you eat breakfast, please do so within one hour of getting up.   Each meal should contain half fruits/vegetables, one quarter protein, and one quarter carbs (no bigger than a computer mouse)  Cut down on sweet beverages. This includes juice, soda, and sweet tea.   Drink at least 1 glass of water with each meal and aim for at least 8 glasses per day  Exercise at least 150 minutes every week.    Preventive Care 62-40 Years Old, Male Preventive care refers to lifestyle choices and visits with your health care provider that can promote health and wellness. Preventive care visits are also called wellness exams. What can I expect for my preventive care visit? Counseling During your preventive care visit, your health care provider may ask about your: Medical history, including: Past medical problems. Family medical history. Current health, including: Emotional well-being. Home life and relationship well-being. Sexual activity. Lifestyle, including: Alcohol, nicotine or tobacco, and drug use. Access to firearms. Diet, exercise, and sleep habits. Safety issues such as seatbelt and bike helmet use. Sunscreen use. Work and work Astronomer. Physical exam Your health care provider will check your: Height and weight. These may be used to calculate your BMI (body mass index). BMI is a measurement that tells if you are at a healthy weight. Waist circumference. This measures the distance around your waistline. This measurement also tells if you are at a healthy weight and may help predict your risk of  certain diseases, such as type 2 diabetes and high blood pressure. Heart rate and blood pressure. Body temperature. Skin for abnormal spots. What immunizations do I need?  Vaccines are usually given at various ages, according to a schedule. Your health care provider will recommend vaccines for you based on your age, medical history, and  lifestyle or other factors, such as travel or where you work. What tests do I need? Screening Your health care provider may recommend screening tests for certain conditions. This may include: Lipid and cholesterol levels. Diabetes screening. This is done by checking your blood sugar (glucose) after you have not eaten for a while (fasting). Hepatitis B test. Hepatitis C test. HIV (human immunodeficiency virus) test. STI (sexually transmitted infection) testing, if you are at risk. Lung cancer screening. Prostate cancer screening. Colorectal cancer screening. Talk with your health care provider about your test results, treatment options, and if necessary, the need for more tests. Follow these instructions at home: Eating and drinking  Eat a diet that includes fresh fruits and vegetables, whole grains, lean protein, and low-fat dairy products. Take vitamin and mineral supplements as recommended by your health care provider. Do not drink alcohol if your health care provider tells you not to drink. If you drink alcohol: Limit how much you have to 0-2 drinks a day. Know how much alcohol is in your drink. In the [place] one drink equals one 12 oz bottle of beer (355 mL), one 5 oz glass of wine (148 mL), or one 1 oz glass of hard liquor (44 mL). Lifestyle Brush your teeth every morning and night with fluoride toothpaste. Floss one time each day. Exercise for at least 30 minutes 5 or more days each week. Do not use any products that contain nicotine or tobacco. These products include cigarettes, chewing tobacco, and vaping devices, such as e-cigarettes. If you need help quitting, ask your health care provider. Do not use drugs. If you are sexually active, practice safe sex. Use a condom or other form of protection to prevent STIs. Take aspirin only as told by your health care provider. Make sure that you understand how much to take and what form to take. Work with your health care provider to find  out whether it is safe and beneficial for you to take aspirin daily. Find healthy ways to manage stress, such as: Meditation, yoga, or listening to music. Journaling. Talking to a trusted person. Spending time with friends and family. Minimize exposure to UV radiation to reduce your risk of skin cancer. Safety Always wear your seat belt while driving or riding in a vehicle. Do not drive: If you have been drinking alcohol. Do not ride with someone who has been drinking. When you are tired or distracted. While texting. If you have been using any mind-altering substances or drugs. Wear a helmet and other protective equipment during sports activities. If you have firearms in your house, make sure you follow all gun safety procedures. What's next? Go to your health care provider once a year for an annual wellness visit. Ask your health care provider how often you should have your eyes and teeth checked. Stay up to date on all vaccines. This information is not intended to replace advice given to you by your health care provider. Make sure you discuss any questions you have with your health care provider. Document Revised: 02/19/2021 Document Reviewed: 02/19/2021 Elsevier Patient Education  2024 [employer].

## 2024-06-23 ENCOUNTER — Ambulatory Visit: Payer: Self-pay | Admitting: Family Medicine

## 2024-06-23 NOTE — Progress Notes (Signed)
 LDL mildly elevated but better than last year.  The rest of his labs are all stable.  Do not need to make any changes to his treatment plan at this time.  We will contact him once we get results back on his heart monitor.  We can recheck all of his labs next year.

## 2024-07-02 DIAGNOSIS — R002 Palpitations: Secondary | ICD-10-CM

## 2024-07-03 ENCOUNTER — Ambulatory Visit: Payer: Self-pay | Admitting: Family Medicine

## 2024-07-03 NOTE — Progress Notes (Signed)
 Heart monitor shows occasional premature beat.  This is benign.  Do not need to do any other testing at this point however we can refer him to cardiology for symptomatic management if he wishes.

## 2025-06-22 ENCOUNTER — Encounter: Admitting: Family Medicine
